# Patient Record
Sex: Male | Born: 1950 | Race: White | Hispanic: No | Smoking: Current some day smoker
Health system: Southern US, Community
[De-identification: ages and names within clinical notes are randomized; demographics above are authoritative.]

## PROBLEM LIST (undated history)

## (undated) DIAGNOSIS — N400 Enlarged prostate without lower urinary tract symptoms: Secondary | ICD-10-CM

## (undated) DIAGNOSIS — E871 Hypo-osmolality and hyponatremia: Secondary | ICD-10-CM

## (undated) DIAGNOSIS — G4700 Insomnia, unspecified: Secondary | ICD-10-CM

## (undated) DIAGNOSIS — C801 Malignant (primary) neoplasm, unspecified: Secondary | ICD-10-CM

## (undated) DIAGNOSIS — F32A Depression, unspecified: Secondary | ICD-10-CM

## (undated) DIAGNOSIS — I10 Essential (primary) hypertension: Secondary | ICD-10-CM

## (undated) DIAGNOSIS — G8929 Other chronic pain: Secondary | ICD-10-CM

## (undated) DIAGNOSIS — E785 Hyperlipidemia, unspecified: Secondary | ICD-10-CM

## (undated) DIAGNOSIS — K5909 Other constipation: Secondary | ICD-10-CM

## (undated) DIAGNOSIS — B1920 Unspecified viral hepatitis C without hepatic coma: Secondary | ICD-10-CM

## (undated) DIAGNOSIS — J449 Chronic obstructive pulmonary disease, unspecified: Secondary | ICD-10-CM

## (undated) DIAGNOSIS — F329 Major depressive disorder, single episode, unspecified: Secondary | ICD-10-CM

## (undated) DIAGNOSIS — E039 Hypothyroidism, unspecified: Secondary | ICD-10-CM

## (undated) HISTORY — PX: ADRENALECTOMY: SHX876

## (undated) HISTORY — PX: NEPHRECTOMY: SHX65

---

## 2015-08-25 ENCOUNTER — Emergency Department (HOSPITAL_COMMUNITY)
Admission: EM | Admit: 2015-08-25 | Discharge: 2015-08-25 | Disposition: A | Discharge status: Home / Self Care | Payer: Medicare Other | Attending: Emergency Medicine | Admitting: Emergency Medicine

## 2015-08-25 ENCOUNTER — Encounter (HOSPITAL_COMMUNITY): Payer: Self-pay | Admitting: Emergency Medicine

## 2015-08-25 ENCOUNTER — Emergency Department (HOSPITAL_COMMUNITY): Payer: Medicare Other

## 2015-08-25 DIAGNOSIS — Z87438 Personal history of other diseases of male genital organs: Secondary | ICD-10-CM | POA: Diagnosis not present

## 2015-08-25 DIAGNOSIS — R3919 Other difficulties with micturition: Secondary | ICD-10-CM | POA: Diagnosis not present

## 2015-08-25 DIAGNOSIS — Z8669 Personal history of other diseases of the nervous system and sense organs: Secondary | ICD-10-CM | POA: Insufficient documentation

## 2015-08-25 DIAGNOSIS — J449 Chronic obstructive pulmonary disease, unspecified: Secondary | ICD-10-CM | POA: Insufficient documentation

## 2015-08-25 DIAGNOSIS — I10 Essential (primary) hypertension: Secondary | ICD-10-CM | POA: Insufficient documentation

## 2015-08-25 DIAGNOSIS — Z8619 Personal history of other infectious and parasitic diseases: Secondary | ICD-10-CM | POA: Insufficient documentation

## 2015-08-25 DIAGNOSIS — Z8639 Personal history of other endocrine, nutritional and metabolic disease: Secondary | ICD-10-CM | POA: Insufficient documentation

## 2015-08-25 DIAGNOSIS — Z8659 Personal history of other mental and behavioral disorders: Secondary | ICD-10-CM | POA: Insufficient documentation

## 2015-08-25 DIAGNOSIS — Z8719 Personal history of other diseases of the digestive system: Secondary | ICD-10-CM | POA: Diagnosis not present

## 2015-08-25 DIAGNOSIS — R339 Retention of urine, unspecified: Secondary | ICD-10-CM | POA: Diagnosis present

## 2015-08-25 DIAGNOSIS — Z87891 Personal history of nicotine dependence: Secondary | ICD-10-CM | POA: Insufficient documentation

## 2015-08-25 DIAGNOSIS — R39198 Other difficulties with micturition: Secondary | ICD-10-CM

## 2015-08-25 HISTORY — DX: Malignant (primary) neoplasm, unspecified: C80.1

## 2015-08-25 HISTORY — DX: Benign prostatic hyperplasia without lower urinary tract symptoms: N40.0

## 2015-08-25 HISTORY — DX: Other constipation: K59.09

## 2015-08-25 HISTORY — DX: Unspecified viral hepatitis C without hepatic coma: B19.20

## 2015-08-25 HISTORY — DX: Insomnia, unspecified: G47.00

## 2015-08-25 HISTORY — DX: Chronic obstructive pulmonary disease, unspecified: J44.9

## 2015-08-25 HISTORY — DX: Major depressive disorder, single episode, unspecified: F32.9

## 2015-08-25 HISTORY — DX: Depression, unspecified: F32.A

## 2015-08-25 HISTORY — DX: Hyperlipidemia, unspecified: E78.5

## 2015-08-25 HISTORY — DX: Hypo-osmolality and hyponatremia: E87.1

## 2015-08-25 HISTORY — DX: Essential (primary) hypertension: I10

## 2015-08-25 LAB — URINALYSIS, ROUTINE W REFLEX MICROSCOPIC
BILIRUBIN URINE: NEGATIVE
Glucose, UA: NEGATIVE mg/dL
KETONES UR: NEGATIVE mg/dL
NITRITE: NEGATIVE
Protein, ur: NEGATIVE mg/dL
SPECIFIC GRAVITY, URINE: 1.008 (ref 1.005–1.030)
UROBILINOGEN UA: 0.2 mg/dL (ref 0.0–1.0)
pH: 7 (ref 5.0–8.0)

## 2015-08-25 LAB — CBC WITH DIFFERENTIAL/PLATELET
BASOS PCT: 0 % (ref 0–1)
Basophils Absolute: 0 10*3/uL (ref 0.0–0.1)
EOS ABS: 0.4 10*3/uL (ref 0.0–0.7)
EOS PCT: 4 % (ref 0–5)
HCT: 39.5 % (ref 39.0–52.0)
Hemoglobin: 13.2 g/dL (ref 13.0–17.0)
LYMPHS ABS: 2.5 10*3/uL (ref 0.7–4.0)
Lymphocytes Relative: 27 % (ref 12–46)
MCH: 29.1 pg (ref 26.0–34.0)
MCHC: 33.4 g/dL (ref 30.0–36.0)
MCV: 87.2 fL (ref 78.0–100.0)
Monocytes Absolute: 0.7 10*3/uL (ref 0.1–1.0)
Monocytes Relative: 7 % (ref 3–12)
NEUTROS PCT: 62 % (ref 43–77)
Neutro Abs: 5.7 10*3/uL (ref 1.7–7.7)
PLATELETS: 239 10*3/uL (ref 150–400)
RBC: 4.53 MIL/uL (ref 4.22–5.81)
RDW: 12.9 % (ref 11.5–15.5)
WBC: 9.2 10*3/uL (ref 4.0–10.5)

## 2015-08-25 LAB — URINE MICROSCOPIC-ADD ON

## 2015-08-25 LAB — BASIC METABOLIC PANEL
Anion gap: 6 (ref 5–15)
BUN: 25 mg/dL — AB (ref 6–20)
CO2: 23 mmol/L (ref 22–32)
CREATININE: 2.02 mg/dL — AB (ref 0.61–1.24)
Calcium: 9.2 mg/dL (ref 8.9–10.3)
Chloride: 99 mmol/L — ABNORMAL LOW (ref 101–111)
GFR, EST AFRICAN AMERICAN: 38 mL/min — AB (ref >60)
GFR, EST NON AFRICAN AMERICAN: 33 mL/min — AB (ref >60)
Glucose, Bld: 90 mg/dL (ref 65–99)
Potassium: 4.3 mmol/L (ref 3.5–5.1)
SODIUM: 128 mmol/L — AB (ref 135–145)

## 2015-08-25 MED ORDER — CEPHALEXIN 500 MG PO CAPS: 500.0000 mg | ORAL_CAPSULE | Freq: Three times a day (TID) | ORAL | Status: DC

## 2015-08-25 NOTE — Discharge Instructions (Signed)
Take the prescribed medication as directed. Follow-up with your urologist as soon as possible. Return to the ED for new or worsening symptoms.

## 2015-08-25 NOTE — ED Notes (Signed)
Patient presents to ED via [facility].  Patient states he urinated this morning, but now it's hard to pee.

## 2015-08-25 NOTE — Progress Notes (Signed)
Patient listed as having Medicare insurance without a pcp.  Patient reports he is from [place].  Patient reports he has a doctor at the [facility] in [place].  Per ALF paper work, patient's pcp at nursing facility is Dr Cipriano Bunker.  System updated.

## 2015-08-25 NOTE — ED Notes (Signed)
Patient had 250cc of urine out with zero on the bladder scan after void.

## 2015-08-25 NOTE — ED Notes (Signed)
Patient finished dinner tray and is putting his clothes on at this time.  Awaiting transport back to [facility].

## 2015-08-25 NOTE — ED Provider Notes (Signed)
CSN: [id number]     Arrival date & time 08/25/15  1609 History   First MD Initiated Contact with Patient 08/25/15 1620     Chief Complaint  Patient presents with  . Urinary Retention     (Consider location/radiation/quality/duration/timing/severity/associated sxs/prior Treatment) The history is provided by the patient and medical records.    This is a 64 y.o. M with hx of HTN, hyponatremia, HLP, BPH, hepatitis, COPD, depression, insomnia, presenting to the ED for difficulty urinating.  Patient states he was able to urinate normally this morning, however has since been having some difficulty urinating.  He states he feels the need to urinate, however has some difficulty actually passing the urine.  He denies dysuria or hematuria.  He does take flomax for BPH.  He is followed by urologist in [facility], does not remember his name.  He has never required foley catheter placement.  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  He states he has chronic constipation, last BM was yesterday.  He is freely passing flatus.  No fever, chills, sweats, or weakness noted.  VSS.  Past Medical History  Diagnosis Date  . Hyponatremia   . Hypertension   . Hyperlipidemia   . BPH (benign prostatic hypertrophy)   . Hepatitis C   . COPD (chronic obstructive pulmonary disease)   . Chronic constipation   . Cancer   . Depression   . Insomnia    History reviewed. No pertinent past surgical history. No family history on file. Social History  Substance Use Topics  . Smoking status: Former Research scientist (life sciences)  . Smokeless tobacco: None  . Alcohol Use: None    Review of Systems  Genitourinary: Positive for difficulty urinating.  All other systems reviewed and are negative.     Allergies  Review of patient's allergies indicates no known allergies.  Home Medications   Prior to Admission medications   Not on File   BP 149/89 mmHg  Pulse 65  Temp(Src) 98.9 F (37.2 C) (Oral)  Resp 20   Ht 5\' 7"  (1.702 m)  Wt 140 lb (63.504 kg)  BMI 21.92 kg/m2  SpO2 98%   Physical Exam  Constitutional: He is oriented to person, place, and time. He appears well-developed and well-nourished.  HENT:  Head: Normocephalic and atraumatic.  Mouth/Throat: Oropharynx is clear and moist.  Eyes: Conjunctivae and EOM are normal. Pupils are equal, round, and reactive to light.  Neck: Normal range of motion.  Cardiovascular: Normal rate, regular rhythm and normal heart sounds.   Pulmonary/Chest: Effort normal and breath sounds normal. No respiratory distress. He has no wheezes.  Abdominal: Soft. Bowel sounds are normal. He exhibits no distension. There is no tenderness. There is no guarding.  Abdomen soft, nondistended, normal bowel sounds, no tenderness or peritoneal signs  Musculoskeletal: Normal range of motion. He exhibits no edema.  Neurological: He is alert and oriented to person, place, and time.  Skin: Skin is warm and dry.  Psychiatric: He has a normal mood and affect.  Nursing note and vitals reviewed.   ED Course  Procedures (including critical care time) Labs Review Labs Reviewed  URINALYSIS, ROUTINE W REFLEX MICROSCOPIC (NOT AT [facility]) - Abnormal; Notable for the following:    APPearance CLOUDY (*)    Hgb urine dipstick TRACE (*)    Leukocytes, UA SMALL (*)    All other components within normal limits  BASIC METABOLIC PANEL - Abnormal; Notable for the following:  Sodium 128 (*)    Chloride 99 (*)    BUN 25 (*)    Creatinine, Ser 2.02 (*)    GFR calc non Af Amer 33 (*)    GFR calc Af Amer 38 (*)    All other components within normal limits  URINE MICROSCOPIC-ADD ON - Abnormal; Notable for the following:    Bacteria, UA FEW (*)    All other components within normal limits  URINE CULTURE  CBC WITH DIFFERENTIAL/PLATELET    Imaging Review Dg Abd 1 View  08/25/2015   CLINICAL DATA:  Difficulty urinating and constipation.  EXAM: ABDOMEN - 1 VIEW  COMPARISON:  None.   FINDINGS: Bowel gas pattern appears nonobstructive. No evidence of soft tissue mass or abnormal fluid collection seen. Bladder does, however, appear moderately distended within the pelvis. No evidence of free intraperitoneal air.  Surgical sutures are seen along the midline abdomen and pelvis. Mild degenerative change noted within the lower lumbar spine. No acute osseous abnormality seen.  IMPRESSION: Nonobstructive bowel gas pattern and no evidence of acute intra-abdominal abnormality.  Bladder appears to be at least moderately distended within the pelvis, compatible with given clinical data of difficulty urinating.   Electronically Signed   By: Franki Cabot M.D.   On: 08/25/2015 16:43   I have personally reviewed and evaluated these lab results as part of my medical decision-making.   EKG Interpretation None      MDM   Final diagnoses:  Difficulty urinating   64 year old male here with difficulty urinating. He reports straining with urination. He has a history of BPH, currently on Flomax. Followed by neurology in [facility]. Patient is afebrile, nontoxic. Abdominal exam is benign.  SrCr 2.02-- patient has known CKD and solitary kidney.  This is likely his baseline, however no prior values available for comparison.  abd films taken prior to urination, some bladder distention noted without obstructive symptoms.  Patient was able to fully urinate 250 mL here in ED, pos tvoid residual of 0.  U/a with few bacteria, 7-10.  Will send for culture.  His change in urinary symptoms may be due to his BPH, however u/a with questionable infection.  Will start on keflex pending urine culture.  Patient to follow-up with his urologist.  Discussed plan with patient, he/she acknowledged understanding and agreed with plan of care.  Return precautions given for new or worsening symptoms.  Case discussed with attending physician, Dr. Johnney Killian, who evaluated patient and agrees with assessment and plan of  care.  Larene Pickett, PA-C 08/25/15 [place], MD 09/07/15 [phone]

## 2015-08-29 LAB — URINE CULTURE: Culture: >=100000

## 2015-08-30 NOTE — Progress Notes (Signed)
ED Antimicrobial Stewardship Positive Culture Follow Up   Luke Wiggins is an 64 y.o. male who presented to [facility] on 08/25/2015 with a chief complaint of  Chief Complaint  Patient presents with  . Urinary Retention    Recent Results (from the past 720 hour(s))  Urine culture     Status: None   Collection Time: 08/25/15  5:57 PM  Result Value Ref Range Status   Specimen Description URINE, CLEAN CATCH  Final   Special Requests NONE  Final   Culture   Final    >=100,000 COLONIES/mL STAPHYLOCOCCUS SPECIES (COAGULASE NEGATIVE) Performed at [facility]    Report Status 08/29/2015 FINAL  Final   Organism ID, Bacteria STAPHYLOCOCCUS SPECIES (COAGULASE NEGATIVE)  Final      Susceptibility   Staphylococcus species (coagulase negative) - MIC*    CIPROFLOXACIN >=8 RESISTANT Resistant     GENTAMICIN <=0.5 SENSITIVE Sensitive     NITROFURANTOIN <=16 SENSITIVE Sensitive     OXACILLIN <=0.25 RESISTANT Resistant     TETRACYCLINE <=1 SENSITIVE Sensitive     VANCOMYCIN <=0.5 SENSITIVE Sensitive     TRIMETH/SULFA <=10 SENSITIVE Sensitive     CLINDAMYCIN <=0.25 RESISTANT Resistant     RIFAMPIN <=0.5 SENSITIVE Sensitive     Inducible Clindamycin POSITIVE Resistant     * >=100,000 COLONIES/mL STAPHYLOCOCCUS SPECIES (COAGULASE NEGATIVE)    [x]  Treated with cephalexin, organism resistant to prescribed antimicrobial  New antibiotic prescription: Stop Keflex. Start Doxycycline 100 mg BID x 7 days.   ED Provider: Glendell Docker, PA-C   Ricka Burdock 08/30/2015, 8:14 AM Infectious Diseases Pharmacist Phone# [phone]

## 2015-08-31 ENCOUNTER — Telehealth (HOSPITAL_COMMUNITY): Payer: Self-pay | Admitting: Emergency Medicine

## 2015-08-31 NOTE — Telephone Encounter (Signed)
pt resident @ [facility]. Notified Keisha @ [facility] of positive urine culture and need for change in antibiotic. Result with new med order faxed to [facility] [phone].

## 2015-09-07 ENCOUNTER — Inpatient Hospital Stay (HOSPITAL_COMMUNITY)
Admission: EM | Admit: 2015-09-07 | Discharge: 2015-09-08 | DRG: 392 | Disposition: A | Discharge status: Home / Self Care | Payer: Medicare Other | Attending: Internal Medicine | Admitting: Internal Medicine

## 2015-09-07 ENCOUNTER — Emergency Department (HOSPITAL_COMMUNITY): Payer: Medicare Other

## 2015-09-07 ENCOUNTER — Encounter (HOSPITAL_COMMUNITY): Payer: Self-pay | Admitting: *Deleted

## 2015-09-07 DIAGNOSIS — I700 Atherosclerosis of aorta: Secondary | ICD-10-CM | POA: Diagnosis present

## 2015-09-07 DIAGNOSIS — Z8619 Personal history of other infectious and parasitic diseases: Secondary | ICD-10-CM

## 2015-09-07 DIAGNOSIS — R109 Unspecified abdominal pain: Secondary | ICD-10-CM | POA: Diagnosis present

## 2015-09-07 DIAGNOSIS — B1920 Unspecified viral hepatitis C without hepatic coma: Secondary | ICD-10-CM | POA: Diagnosis not present

## 2015-09-07 DIAGNOSIS — F329 Major depressive disorder, single episode, unspecified: Secondary | ICD-10-CM | POA: Diagnosis present

## 2015-09-07 DIAGNOSIS — R1084 Generalized abdominal pain: Secondary | ICD-10-CM | POA: Diagnosis present

## 2015-09-07 DIAGNOSIS — I129 Hypertensive chronic kidney disease with stage 1 through stage 4 chronic kidney disease, or unspecified chronic kidney disease: Secondary | ICD-10-CM | POA: Diagnosis present

## 2015-09-07 DIAGNOSIS — G4700 Insomnia, unspecified: Secondary | ICD-10-CM | POA: Diagnosis present

## 2015-09-07 DIAGNOSIS — N183 Chronic kidney disease, stage 3 unspecified: Secondary | ICD-10-CM | POA: Diagnosis present

## 2015-09-07 DIAGNOSIS — J449 Chronic obstructive pulmonary disease, unspecified: Secondary | ICD-10-CM | POA: Diagnosis present

## 2015-09-07 DIAGNOSIS — E039 Hypothyroidism, unspecified: Secondary | ICD-10-CM | POA: Diagnosis present

## 2015-09-07 DIAGNOSIS — R338 Other retention of urine: Secondary | ICD-10-CM | POA: Diagnosis present

## 2015-09-07 DIAGNOSIS — K5900 Constipation, unspecified: Principal | ICD-10-CM | POA: Diagnosis present

## 2015-09-07 DIAGNOSIS — N400 Enlarged prostate without lower urinary tract symptoms: Secondary | ICD-10-CM | POA: Diagnosis not present

## 2015-09-07 DIAGNOSIS — I10 Essential (primary) hypertension: Secondary | ICD-10-CM | POA: Diagnosis not present

## 2015-09-07 DIAGNOSIS — Z7982 Long term (current) use of aspirin: Secondary | ICD-10-CM | POA: Diagnosis not present

## 2015-09-07 DIAGNOSIS — R339 Retention of urine, unspecified: Secondary | ICD-10-CM | POA: Diagnosis not present

## 2015-09-07 DIAGNOSIS — E785 Hyperlipidemia, unspecified: Secondary | ICD-10-CM | POA: Diagnosis present

## 2015-09-07 DIAGNOSIS — N401 Enlarged prostate with lower urinary tract symptoms: Secondary | ICD-10-CM | POA: Diagnosis present

## 2015-09-07 DIAGNOSIS — Z79899 Other long term (current) drug therapy: Secondary | ICD-10-CM

## 2015-09-07 DIAGNOSIS — K219 Gastro-esophageal reflux disease without esophagitis: Secondary | ICD-10-CM | POA: Diagnosis present

## 2015-09-07 DIAGNOSIS — N131 Hydronephrosis with ureteral stricture, not elsewhere classified: Secondary | ICD-10-CM | POA: Diagnosis not present

## 2015-09-07 DIAGNOSIS — Z87891 Personal history of nicotine dependence: Secondary | ICD-10-CM | POA: Diagnosis not present

## 2015-09-07 DIAGNOSIS — F32A Depression, unspecified: Secondary | ICD-10-CM | POA: Diagnosis present

## 2015-09-07 DIAGNOSIS — N1330 Unspecified hydronephrosis: Secondary | ICD-10-CM | POA: Diagnosis present

## 2015-09-07 HISTORY — DX: Hypothyroidism, unspecified: E03.9

## 2015-09-07 LAB — COMPREHENSIVE METABOLIC PANEL
ALBUMIN: 4 g/dL (ref 3.5–5.0)
ALT: 14 U/L — ABNORMAL LOW (ref 17–63)
ANION GAP: 10 (ref 5–15)
AST: 17 U/L (ref 15–41)
Alkaline Phosphatase: 69 U/L (ref 38–126)
BUN: 26 mg/dL — AB (ref 6–20)
CHLORIDE: 103 mmol/L (ref 101–111)
CO2: 17 mmol/L — ABNORMAL LOW (ref 22–32)
Calcium: 9.4 mg/dL (ref 8.9–10.3)
Creatinine, Ser: 1.88 mg/dL — ABNORMAL HIGH (ref 0.61–1.24)
GFR calc Af Amer: 42 mL/min — ABNORMAL LOW (ref >60)
GFR, EST NON AFRICAN AMERICAN: 36 mL/min — AB (ref >60)
Glucose, Bld: 88 mg/dL (ref 65–99)
POTASSIUM: 4.4 mmol/L (ref 3.5–5.1)
Sodium: 130 mmol/L — ABNORMAL LOW (ref 135–145)
Total Bilirubin: 0.9 mg/dL (ref 0.3–1.2)
Total Protein: 8.3 g/dL — ABNORMAL HIGH (ref 6.5–8.1)

## 2015-09-07 LAB — CBC WITH DIFFERENTIAL/PLATELET
BASOS ABS: 0 10*3/uL (ref 0.0–0.1)
BASOS PCT: 0 % (ref 0–1)
EOS PCT: 3 % (ref 0–5)
Eosinophils Absolute: 0.3 10*3/uL (ref 0.0–0.7)
HCT: 40.7 % (ref 39.0–52.0)
Hemoglobin: 13.4 g/dL (ref 13.0–17.0)
Lymphocytes Relative: 24 % (ref 12–46)
Lymphs Abs: 2.3 10*3/uL (ref 0.7–4.0)
MCH: 28.8 pg (ref 26.0–34.0)
MCHC: 32.9 g/dL (ref 30.0–36.0)
MCV: 87.5 fL (ref 78.0–100.0)
MONO ABS: 0.8 10*3/uL (ref 0.1–1.0)
Monocytes Relative: 8 % (ref 3–12)
Neutro Abs: 6.1 10*3/uL (ref 1.7–7.7)
Neutrophils Relative %: 65 % (ref 43–77)
PLATELETS: 245 10*3/uL (ref 150–400)
RBC: 4.65 MIL/uL (ref 4.22–5.81)
RDW: 13.3 % (ref 11.5–15.5)
WBC: 9.4 10*3/uL (ref 4.0–10.5)

## 2015-09-07 LAB — URINALYSIS, ROUTINE W REFLEX MICROSCOPIC
BILIRUBIN URINE: NEGATIVE
GLUCOSE, UA: NEGATIVE mg/dL
KETONES UR: NEGATIVE mg/dL
Leukocytes, UA: NEGATIVE
NITRITE: NEGATIVE
PH: 6.5 (ref 5.0–8.0)
Protein, ur: 30 mg/dL — AB
Specific Gravity, Urine: 1.01 (ref 1.005–1.030)
Urobilinogen, UA: 0.2 mg/dL (ref 0.0–1.0)

## 2015-09-07 LAB — URINE MICROSCOPIC-ADD ON

## 2015-09-07 LAB — LIPASE, BLOOD: LIPASE: 32 U/L (ref 22–51)

## 2015-09-07 MED ORDER — ONDANSETRON HCL 4 MG/2ML IJ SOLN
4.0000 mg | Freq: Once | INTRAMUSCULAR | Status: AC
  Administered 2015-09-07: 4 mg via INTRAVENOUS
  Filled 2015-09-07: qty 2

## 2015-09-07 MED ORDER — DEXTROSE 5 % IV SOLN
1.0000 g | Freq: Once | INTRAVENOUS | Status: AC
  Administered 2015-09-07: 1 g via INTRAVENOUS
  Filled 2015-09-07: qty 10

## 2015-09-07 MED ORDER — IOHEXOL 300 MG/ML  SOLN
25.0000 mL | Freq: Once | INTRAMUSCULAR | Status: AC | PRN
  Administered 2015-09-07: 25 mL via ORAL

## 2015-09-07 MED ORDER — SODIUM CHLORIDE 0.9 % IV BOLUS (SEPSIS)
1000.0000 mL | Freq: Once | INTRAVENOUS | Status: AC
  Administered 2015-09-07: 1000 mL via INTRAVENOUS

## 2015-09-07 MED ORDER — IOHEXOL 300 MG/ML  SOLN: 100.0000 mL | Freq: Once | INTRAMUSCULAR | Status: DC | PRN

## 2015-09-07 MED ORDER — RANITIDINE HCL 150 MG/10ML PO SYRP
150.0000 mg | ORAL_SOLUTION | Freq: Once | ORAL | Status: AC
  Administered 2015-09-07: 150 mg via ORAL
  Filled 2015-09-07: qty 10

## 2015-09-07 MED ORDER — HYDROCODONE-ACETAMINOPHEN 5-325 MG PO TABS
1.0000 | ORAL_TABLET | Freq: Once | ORAL | Status: AC
  Administered 2015-09-07: 1 via ORAL
  Filled 2015-09-07: qty 1

## 2015-09-07 MED ORDER — ZOLPIDEM TARTRATE 5 MG PO TABS
5.0000 mg | ORAL_TABLET | Freq: Every evening | ORAL | Status: DC | PRN
  Administered 2015-09-07: 5 mg via ORAL
  Filled 2015-09-07: qty 1

## 2015-09-07 NOTE — ED Notes (Signed)
Patient from [facility]. Patient has hx of chronic constipation. Last BM 09/05/15 States abdominal pain started this am 5/10.

## 2015-09-07 NOTE — ED Notes (Signed)
Pt was complaining of experienicing "heart burn" which he reports is a chronic condition. Notified provider. Provider requested an EKG. While obtaining an EKG, patient started vomiting. Provider came to bedside to assess patient.

## 2015-09-07 NOTE — ED Provider Notes (Signed)
CSN: [id number]     Arrival date & time 09/07/15  1641 History   First MD Initiated Contact with Patient 09/07/15 1701     Chief Complaint  Patient presents with  . Abdominal Pain    constipation last BM on 07/05/15     (Consider location/radiation/quality/duration/timing/severity/associated sxs/prior Treatment) Patient is a 64 y.o. male presenting with abdominal pain.  Abdominal Pain Pain location:  Generalized Pain quality: cramping   Pain radiates to:  Does not radiate Pain severity:  Severe Onset quality:  Gradual Duration:  1 day Timing:  Constant Progression:  Worsening Chronicity:  New Ineffective treatments: miralax, lactulose. Associated symptoms: constipation (chronic, last BM was 2 days ago, however on arrival to ED had 4 loose stool, reports continuing pain following this) and diarrhea (on arrival to ED)   Associated symptoms: no chest pain, no cough, no dysuria, no fatigue, no fever, no nausea, no shortness of breath, no sore throat and no vomiting   Risk factors: has not had multiple surgeries and no recent hospitalization     Past Medical History  Diagnosis Date  . Hyponatremia   . Hypertension   . Hyperlipidemia   . BPH (benign prostatic hypertrophy)   . Hepatitis C   . COPD (chronic obstructive pulmonary disease)   . Chronic constipation   . Cancer   . Depression   . Insomnia   . Hypothyroidism    Past Surgical History  Procedure Laterality Date  . Adrenalectomy Left   . Nephrectomy Left    Family History  Problem Relation Age of Onset  . Family history unknown: Yes   Social History  Substance Use Topics  . Smoking status: Former Research scientist (life sciences)  . Smokeless tobacco: None  . Alcohol Use: No    Review of Systems  Constitutional: Negative for fever and fatigue.  HENT: Negative for sore throat.   Eyes: Negative for visual disturbance.  Respiratory: Negative for cough and shortness of breath.   Cardiovascular: Negative for chest pain.   Gastrointestinal: Positive for abdominal pain, diarrhea (on arrival to ED) and constipation (chronic, last BM was 2 days ago, however on arrival to ED had 4 loose stool, reports continuing pain following this). Negative for nausea and vomiting.  Genitourinary: Negative for dysuria and difficulty urinating (had this 8/25, diagnosed with UTI, on doxy).  Musculoskeletal: Negative for back pain and neck stiffness.  Skin: Negative for rash.  Neurological: Negative for syncope and headaches.      Allergies  Review of patient's allergies indicates no known allergies.  Home Medications   Prior to Admission medications   Medication Sig Start Date End Date Taking? Authorizing Provider  amLODipine (NORVASC) 5 MG tablet Take 5 mg by mouth daily.   Yes Historical Provider, MD  aspirin EC 81 MG tablet Take 81 mg by mouth daily.   Yes Historical Provider, MD  calcium carbonate (TUMS - DOSED IN MG ELEMENTAL CALCIUM) 500 MG chewable tablet Chew 2 tablets by mouth every 6 (six) hours.    Yes Historical Provider, MD  docusate sodium (COLACE) 100 MG capsule Take 200 mg by mouth daily.   Yes Historical Provider, MD  finasteride (PROSCAR) 5 MG tablet Take 5 mg by mouth daily.   Yes Historical Provider, MD  folic acid (FOLVITE) 1 MG tablet Take 1 mg by mouth daily.   Yes Historical Provider, MD  lactulose (CHRONULAC) 10 GM/15ML solution Take 30 g by mouth 2 (two) times daily.   Yes Historical Provider, MD  levothyroxine (SYNTHROID,  LEVOTHROID) 25 MCG tablet Take 25 mcg by mouth daily before breakfast.   Yes Historical Provider, MD  Menthol-Methyl Salicylate (BENGAY GREASELESS EX) Apply 1 application topically 3 (three) times daily.   Yes Historical Provider, MD  mirtazapine (REMERON) 30 MG tablet Take 30 mg by mouth at bedtime.   Yes Historical Provider, MD  nepafenac (ILEVRO) 0.3 % ophthalmic suspension Place 1 drop into the left eye daily.   Yes Historical Provider, MD  polyethylene glycol (MIRALAX /  GLYCOLAX) packet Take 17 g by mouth daily.   Yes Historical Provider, MD  sodium chloride 1 G tablet Take 1 g by mouth 2 (two) times daily. 8am and 2pm   Yes Historical Provider, MD  tamsulosin (FLOMAX) 0.4 MG CAPS capsule Take 0.4 mg by mouth daily.   Yes Historical Provider, MD  temazepam (RESTORIL) 30 MG capsule Take 30 mg by mouth at bedtime.    Yes Historical Provider, MD  thiamine (VITAMIN B-1) 100 MG tablet Take 100 mg by mouth daily.   Yes Historical Provider, MD  tiotropium (SPIRIVA) 18 MCG inhalation capsule Place 18 mcg into inhaler and inhale daily.   Yes Historical Provider, MD  albuterol (PROVENTIL HFA;VENTOLIN HFA) 108 (90 BASE) MCG/ACT inhaler Inhale 2 puffs into the lungs every 6 (six) hours as needed for wheezing or shortness of breath.    Historical Provider, MD  cephALEXin (KEFLEX) 500 MG capsule Take 1 capsule (500 mg total) by mouth 3 (three) times daily. Patient not taking: Reported on 09/07/2015 08/25/15   Larene Pickett, PA-C   BP 133/86 mmHg  Pulse 77  Temp(Src) 98.4 F (36.9 C) (Oral)  Resp 18  Ht 5\' 7"  (1.702 m)  SpO2 98% Physical Exam  Constitutional: He is oriented to person, place, and time. He appears well-developed and well-nourished. No distress.  HENT:  Head: Normocephalic and atraumatic.  Eyes: Conjunctivae and EOM are normal.  Neck: Normal range of motion.  Cardiovascular: Normal rate, regular rhythm, normal heart sounds and intact distal pulses.  Exam reveals no gallop and no friction rub.   No murmur heard. Pulmonary/Chest: Effort normal and breath sounds normal. No respiratory distress. He has no wheezes. He has no rales.  Abdominal: Soft. He exhibits no distension. There is tenderness (LLQ). There is no guarding.  Musculoskeletal: He exhibits no edema.  Neurological: He is alert and oriented to person, place, and time.  Skin: Skin is warm and dry. He is not diaphoretic.  Nursing note and vitals reviewed.   ED Course  Procedures (including  critical care time) Labs Review Labs Reviewed  COMPREHENSIVE METABOLIC PANEL - Abnormal; Notable for the following:    Sodium 130 (*)    CO2 17 (*)    BUN 26 (*)    Creatinine, Ser 1.88 (*)    Total Protein 8.3 (*)    ALT 14 (*)    GFR calc non Af Amer 36 (*)    GFR calc Af Amer 42 (*)    All other components within normal limits  URINALYSIS, ROUTINE W REFLEX MICROSCOPIC (NOT AT [facility]) - Abnormal; Notable for the following:    Hgb urine dipstick TRACE (*)    Protein, ur 30 (*)    All other components within normal limits  LIPID PANEL - Abnormal; Notable for the following:    Cholesterol 206 (*)    LDL Cholesterol 137 (*)    All other components within normal limits  BASIC METABOLIC PANEL - Abnormal; Notable for the following:  Sodium 132 (*)    CO2 21 (*)    Glucose, Bld 126 (*)    BUN 22 (*)    Creatinine, Ser 1.78 (*)    Calcium 8.6 (*)    GFR calc non Af Amer 38 (*)    GFR calc Af Amer 44 (*)    All other components within normal limits  CBC - Abnormal; Notable for the following:    WBC 12.7 (*)    All other components within normal limits  MRSA PCR SCREENING  URINE CULTURE  CBC WITH DIFFERENTIAL/PLATELET  LIPASE, BLOOD  URINE MICROSCOPIC-ADD ON  PROTIME-INR  APTT  TSH  GLUCOSE, CAPILLARY  TYPE AND SCREEN  ABO/RH    Imaging Review Ct Abdomen Pelvis Wo Contrast  09/07/2015   CLINICAL DATA:  Chronic constipation. Abdomen pain starting this morning evaluate for diverticulitis.  EXAM: CT ABDOMEN AND PELVIS WITHOUT CONTRAST  TECHNIQUE: Multidetector CT imaging of the abdomen and pelvis was performed following the standard protocol without IV contrast.  COMPARISON:  None.  FINDINGS: The liver, spleen, pancreas, gallbladder are normal. Patient is status post left adrenalectomy and nephrectomy. For there is atherosclerosis of the abdominal aorta without aneurysmal dilatation. There is no abdominal lymphadenopathy.  There is right perinephric stranding with mild right  hydronephrosis and dilatation of the proximal right ureter. The caliber of the mid to distal right ureter is normal. There is a 1 mm stone in a right renal calyx extending towards the pelvis.  For for there is no small bowel obstruction or diverticulitis. Minimal hiatal hernia is identified.  Fluid-filled bladder is normal. There is mild scar of the right lung base. Degenerative joint changes of the spine are noted.  IMPRESSION: Right perinephric stranding with mild right hydronephrosis and dilatation of the proximal right ureter. The caliber of the mid to distal right ureter is normal. There is a 1 mm stone in a right renal calyx extending towards the pelvis. No other renal or ureteral stones are noted. The findings could be due to pyelonephritis or recently passed stone. Clinical correlation is recommended.   Electronically Signed   By: Abelardo Diesel M.D.   On: 09/07/2015 20:06   I have personally reviewed and evaluated these images and lab results as part of my medical decision-making.   EKG Interpretation   Date/Time:  Wednesday September 07 2015 22:42:07 EDT Ventricular Rate:  92 PR Interval:  136 QRS Duration: 85 QT Interval:  346 QTC Calculation: 428 R Axis:   66 Text Interpretation:  Sinus rhythm Borderline repolarization abnormality  ED PHYSICIAN INTERPRETATION AVAILABLE IN [facility] Confirmed by  TEST, Record ([id number]) on 09/08/2015 7:04:04 AM      MDM   Final diagnoses:  Abdominal pain, generalized  Essential hypertension  Hyperlipidemia  BPH (benign prostatic hypertrophy)  Depression  Insomnia  Hypothyroidism, unspecified hypothyroidism type   64yo male with history of CVA, chronic constipation, left nephrectomy and adrenalectomy, recent visit to ED 8/25 with concerns of urinary retention, presents with concern of abdominal pain.  Pt initially reports constipation for last 2 days, however had several over 4 episodes of diarrhea while in ED and reported continuing pain  despite BM.  Pt had some LLQ tenderness on exam and given continuing pain a CT was ordered to evaluate for diverticulitis. CT did not show any colonic abnormalities, however did show mild hydronephrosis, dilated right ureter, and right renal stranding.  Cr 1.8 (2 on 8/25) with unknown baseline.  Pt with recent urine culture from  8/25 showing coag neg staph and has been on doxycycline.  Urinalysis at this time does not suggest UTI, however given hx and stranding gave rocephin.  Prior to receiving any abx pt developed emesis, which was not relieved by 4mg  of Zofran and had continued episodes in the ED. Pt reported chronic heart burn consistent with prior and ekg done showing no acute abnormalities. Called Urology and discussed concerns of hydronephrosis in pt with solitary kidney, and they recommended placement of foley for relief of possible bladder outlet obstruction and nonemergent consult in the AM if indicated.  Pt to be admitted to hospitalist for concern of intractable emesis, possible right sided ?pyelonephritis vs other obstruction in pt with solitary kidney.     Gareth Morgan, MD 09/08/15 1217

## 2015-09-07 NOTE — ED Notes (Signed)
Bed: [facility] Expected date:  Expected time:  Means of arrival:  Comments: EMS- 64yo M, abdominal pain

## 2015-09-07 NOTE — ED Notes (Signed)
Bed: [facility]23 Expected date:  Expected time:  Means of arrival:  Comments: [facility] 

## 2015-09-07 NOTE — ED Notes (Signed)
Patient transported to CT 

## 2015-09-07 NOTE — H&P (Signed)
[facility] History and Physical  Brennon Otterness CXK:[id number] DOB: Mar 16, 1951 DOA: 09/07/2015  Referring physician: ED physician PCP: Mabeline Caras, NP  Specialists:   Chief Complaint: Constipation, abdominal pain, nausea, vomiting, heartburn, increased urinary frequency  HPI: Luke Wiggins is a 64 y.o. male with PMH of hypertension, hyperlipidemia, hypothyroidism, depression, BPH, HCV, COPD, cancer (possible left adrenal cancer?), s/p of left adrenalectomy and nephrectomy, insomnia, possible CKD-III, who presents with constipation, abdominal pain, heartburn, increased urinary frequency.  Patient reports that he has chronic constipation, which has been worsening recently. His last bowel movement was on 09/05/15. He has nausea, and vomited twice, without blood in the vomitus. He took laxatives without help, but he had 3 bowel movements in the emergency room today. He also reports increased urinary frequency and mild burning on urination, but no dysuria. He does not have flank pain. Patient does not have chest pain, shortness of breath, cough, rashes, unilateral weakness. Patient reports that he has chronic hot burning, which becomes more severe recently.  In ED, patient was found to have negative urinalysis, lipase is 32, WBC 9.4, temperature 98.4, no tachycardia, creatinine 1.88 which was 2.02 on 08/25/15. CT-abdomen/pelvis showed Right perinephric stranding with mild right hydronephrosis and dilatation of the proximal right ureter. The caliber of the mid to distal right ureter is normal. There is a 1 mm stone in a right renal calyx extending towards the pelvis. No other renal or ureteral stones are noted. The findings could be due to pyelonephritis or recently passed stone.    Where does patient live?    Assistant living facility Can patient participate in ADLs? None  Review of Systems:   General: no fevers, chills, no changes in body weight, has poor appetite, has fatigue HEENT: no blurry  vision, hearing changes or sore throat Pulm: no dyspnea, coughing, wheezing CV: no chest pain, palpitations Abd: has nausea, vomiting, abdominal pain, constipation. No diarrhea. GU: no dysuria, has burning on urination, increased urinary frequency, no hematuria  Ext: no leg edema Neuro: no unilateral weakness, numbness, or tingling, no vision change or hearing loss Skin: no rash MSK: No muscle spasm, no deformity, no limitation of range of movement in spin Heme: No easy bruising.  Travel history: No recent long distant travel.  Allergy: No Known Allergies  Past Medical History  Diagnosis Date  . Hyponatremia   . Hypertension   . Hyperlipidemia   . BPH (benign prostatic hypertrophy)   . Hepatitis C   . COPD (chronic obstructive pulmonary disease)   . Chronic constipation   . Cancer   . Depression   . Insomnia   . Hypothyroidism     Past Surgical History  Procedure Laterality Date  . Adrenalectomy Left   . Nephrectomy Left     Social History:  reports that he has quit smoking. He does not have any smokeless tobacco history on file. He reports that he does not drink alcohol. His drug history is not on file.  Family History:  Family History  Problem Relation Age of Onset  . Family history unknown: Yes     Prior to Admission medications   Medication Sig Start Date End Date Taking? Authorizing Provider  amLODipine (NORVASC) 5 MG tablet Take 5 mg by mouth daily.   Yes Historical Provider, MD  aspirin EC 81 MG tablet Take 81 mg by mouth daily.   Yes Historical Provider, MD  calcium carbonate (TUMS - DOSED IN MG ELEMENTAL CALCIUM) 500 MG chewable tablet Chew 2 tablets by  mouth every 6 (six) hours.    Yes Historical Provider, MD  docusate sodium (COLACE) 100 MG capsule Take 200 mg by mouth daily.   Yes Historical Provider, MD  finasteride (PROSCAR) 5 MG tablet Take 5 mg by mouth daily.   Yes Historical Provider, MD  folic acid (FOLVITE) 1 MG tablet Take 1 mg by mouth daily.    Yes Historical Provider, MD  lactulose (CHRONULAC) 10 GM/15ML solution Take 30 g by mouth 2 (two) times daily.   Yes Historical Provider, MD  levothyroxine (SYNTHROID, LEVOTHROID) 25 MCG tablet Take 25 mcg by mouth daily before breakfast.   Yes Historical Provider, MD  Menthol-Methyl Salicylate (BENGAY GREASELESS EX) Apply 1 application topically 3 (three) times daily.   Yes Historical Provider, MD  mirtazapine (REMERON) 30 MG tablet Take 30 mg by mouth at bedtime.   Yes Historical Provider, MD  nepafenac (ILEVRO) 0.3 % ophthalmic suspension Place 1 drop into the left eye daily.   Yes Historical Provider, MD  polyethylene glycol (MIRALAX / GLYCOLAX) packet Take 17 g by mouth daily.   Yes Historical Provider, MD  sodium chloride 1 G tablet Take 1 g by mouth 2 (two) times daily. 8am and 2pm   Yes Historical Provider, MD  tamsulosin (FLOMAX) 0.4 MG CAPS capsule Take 0.4 mg by mouth daily.   Yes Historical Provider, MD  temazepam (RESTORIL) 30 MG capsule Take 30 mg by mouth at bedtime.    Yes Historical Provider, MD  thiamine (VITAMIN B-1) 100 MG tablet Take 100 mg by mouth daily.   Yes Historical Provider, MD  tiotropium (SPIRIVA) 18 MCG inhalation capsule Place 18 mcg into inhaler and inhale daily.   Yes Historical Provider, MD  albuterol (PROVENTIL HFA;VENTOLIN HFA) 108 (90 BASE) MCG/ACT inhaler Inhale 2 puffs into the lungs every 6 (six) hours as needed for wheezing or shortness of breath.    Historical Provider, MD  cephALEXin (KEFLEX) 500 MG capsule Take 1 capsule (500 mg total) by mouth 3 (three) times daily. Patient not taking: Reported on 09/07/2015 08/25/15   Larene Pickett, PA-C    Physical Exam: Filed Vitals:   09/07/15 2330 09/08/15 0041 09/08/15 0100 09/08/15 0215  BP: 140/91 145/92  127/87  Pulse: 82 78    Temp:  98.1 F (36.7 C)    TempSrc:  Oral    Resp: 16 20    Height:   5\' 7"  (1.702 m)   SpO2: 95% 99%     General: Not in acute distress HEENT:       Eyes: PERRL, EOMI, no  scleral icterus.       ENT: No discharge from the ears and nose, no pharynx injection, no tonsillar enlargement.        Neck: No JVD, no bruit, no mass felt. Heme: No neck lymph node enlargement. Cardiac: S1/S2, RRR, No murmurs, No gallops or rubs. Pulm:  No rales, wheezing, rhonchi or rubs. Abd: Soft, nondistended, has mild tenderness over central abdomen, no rebound pain, no organomegaly, BS present. Has two surgical scars.  Ext: No pitting leg edema bilaterally. 2+DP/PT pulse bilaterally. Musculoskeletal: No joint deformities, No joint redness or warmth, no limitation of ROM in spin. Skin: No rashes.  Neuro: Alert, oriented X3, cranial nerves II-XII grossly intact, muscle strength 5/5 in all extremities, sensation to light touch intact.  Psych: Patient is not psychotic, no suicidal or hemocidal ideation.  Labs on Admission:  Basic Metabolic Panel:  Recent Labs Lab 09/07/15 1842 09/08/15 0107  NA  130* 132*  K 4.4 3.8  CL 103 103  CO2 17* 21*  GLUCOSE 88 126*  BUN 26* 22*  CREATININE 1.88* 1.78*  CALCIUM 9.4 8.6*   Liver Function Tests:  Recent Labs Lab 09/07/15 1842  AST 17  ALT 14*  ALKPHOS 69  BILITOT 0.9  PROT 8.3*  ALBUMIN 4.0    Recent Labs Lab 09/07/15 1842  LIPASE 32   No results for input(s): AMMONIA in the last 168 hours. CBC:  Recent Labs Lab 09/07/15 1842 09/08/15 0107  WBC 9.4 12.7*  NEUTROABS 6.1  --   HGB 13.4 13.4  HCT 40.7 39.8  MCV 87.5 86.9  PLT 245 237   Cardiac Enzymes: No results for input(s): CKTOTAL, CKMB, CKMBINDEX, TROPONINI in the last 168 hours.  BNP (last 3 results) No results for input(s): BNP in the last 8760 hours.  ProBNP (last 3 results) No results for input(s): PROBNP in the last 8760 hours.  CBG: No results for input(s): GLUCAP in the last 168 hours.  Radiological Exams on Admission: Ct Abdomen Pelvis Wo Contrast  09/07/2015   CLINICAL DATA:  Chronic constipation. Abdomen pain starting this morning evaluate  for diverticulitis.  EXAM: CT ABDOMEN AND PELVIS WITHOUT CONTRAST  TECHNIQUE: Multidetector CT imaging of the abdomen and pelvis was performed following the standard protocol without IV contrast.  COMPARISON:  None.  FINDINGS: The liver, spleen, pancreas, gallbladder are normal. Patient is status post left adrenalectomy and nephrectomy. For there is atherosclerosis of the abdominal aorta without aneurysmal dilatation. There is no abdominal lymphadenopathy.  There is right perinephric stranding with mild right hydronephrosis and dilatation of the proximal right ureter. The caliber of the mid to distal right ureter is normal. There is a 1 mm stone in a right renal calyx extending towards the pelvis.  For for there is no small bowel obstruction or diverticulitis. Minimal hiatal hernia is identified.  Fluid-filled bladder is normal. There is mild scar of the right lung base. Degenerative joint changes of the spine are noted.  IMPRESSION: Right perinephric stranding with mild right hydronephrosis and dilatation of the proximal right ureter. The caliber of the mid to distal right ureter is normal. There is a 1 mm stone in a right renal calyx extending towards the pelvis. No other renal or ureteral stones are noted. The findings could be due to pyelonephritis or recently passed stone. Clinical correlation is recommended.   Electronically Signed   By: Abelardo Diesel M.D.   On: 09/07/2015 20:06    EKG: Independently reviewed.  Abnormal findings: T-wave inversion in V5-V6  Assessment/Plan Principal Problem:   Abdominal pain Active Problems:   Hydronephrosis   Hypertension   Hyperlipidemia   BPH (benign prostatic hypertrophy)   Hepatitis C   COPD (chronic obstructive pulmonary disease)   Depression   Insomnia   Hypothyroidism   CKD (chronic kidney disease) stage 3, GFR 30-59 ml/min   GERD (gastroesophageal reflux disease)   Constipation  Abdominal pain: Likely due to constipation which has resolved in  emergency room. Lipase is 32, unlikely to have pancreatitis. Patient has right hydronephrosis and a small stone which may have also contributed to abdominal pain. -will admit to med-surg bed -When necessary Zofran for nausea, morphine for pain  Hydronephrosis: CT-abdomen/pelvis showed right perinephric stranding with mild right hydronephrosis and dilatation of the proximal right ureter. There is a 1 mm stone in a right renal calyx extending towards the pelvis. Patient has negative urinalysis, no fever or chills, therefore  clinically patient does not seem to have urinary tract infection. - EDP, Dr. Billy Fischer consulted urologist on-call, who was not impressed by CT finding, commended Foley catheter placement. - ED gave one dose of Rocephin, I will hold Rocephin now and follow urine culture.  GERD: -Pepcid IV bid  HTN: -Amlodipine -Patient is also on Flomax which is for BPH  BPH: stable - Continue Flomax and Proscar  HLD: Last LDL was not on record. Not on med at home -Check FLP  COPD: Stable. No signs of acute exacerbation. -When necessary albuterol nebs -Spirava inhaler  Depression: Stable, no suicidal or homicidal ideations. -Continue home medications: Mirtazapine   Insomnia: -prn Restoril  Hypothyroidism: Last TSH was not on record -Continue home Synthroid -Check TSH  Possible CKD (chronic kidney disease) stage 3: Previous creatinine was 2.20 on 08/25/15. His creatinine is 1.88 on admission, which is stable -f/u renal functions. BMP  Constipation: Seems to be resolved. -Continue MiraLAX, lactulose, Colace  DVT ppx: SCD  Code Status: Partial (OK with CPR, but not intubation) Family Communication: None at bed side.      Disposition Plan: Admit to inpatient   Date of Service 09/08/2015    Ivor Costa [facility] Pager [phone]  If 7PM-7AM, please contact night-coverage [email or web address] Password [email or web address] 09/08/2015, 3:45 AM

## 2015-09-07 NOTE — ED Notes (Signed)
Patient had large loose brown BM.

## 2015-09-08 DIAGNOSIS — I10 Essential (primary) hypertension: Secondary | ICD-10-CM | POA: Insufficient documentation

## 2015-09-08 DIAGNOSIS — R339 Retention of urine, unspecified: Secondary | ICD-10-CM

## 2015-09-08 DIAGNOSIS — E039 Hypothyroidism, unspecified: Secondary | ICD-10-CM | POA: Insufficient documentation

## 2015-09-08 DIAGNOSIS — N183 Chronic kidney disease, stage 3 (moderate): Secondary | ICD-10-CM

## 2015-09-08 DIAGNOSIS — R1084 Generalized abdominal pain: Secondary | ICD-10-CM | POA: Insufficient documentation

## 2015-09-08 DIAGNOSIS — K5900 Constipation, unspecified: Secondary | ICD-10-CM | POA: Diagnosis present

## 2015-09-08 DIAGNOSIS — N400 Enlarged prostate without lower urinary tract symptoms: Secondary | ICD-10-CM | POA: Diagnosis not present

## 2015-09-08 DIAGNOSIS — K219 Gastro-esophageal reflux disease without esophagitis: Secondary | ICD-10-CM | POA: Diagnosis present

## 2015-09-08 LAB — PROTIME-INR
INR: 0.99 (ref 0.00–1.49)
PROTHROMBIN TIME: 13.3 s (ref 11.6–15.2)

## 2015-09-08 LAB — CBC
HCT: 39.8 % (ref 39.0–52.0)
Hemoglobin: 13.4 g/dL (ref 13.0–17.0)
MCH: 29.3 pg (ref 26.0–34.0)
MCHC: 33.7 g/dL (ref 30.0–36.0)
MCV: 86.9 fL (ref 78.0–100.0)
PLATELETS: 237 10*3/uL (ref 150–400)
RBC: 4.58 MIL/uL (ref 4.22–5.81)
RDW: 13.2 % (ref 11.5–15.5)
WBC: 12.7 10*3/uL — AB (ref 4.0–10.5)

## 2015-09-08 LAB — LIPID PANEL
CHOL/HDL RATIO: 4.6 ratio
CHOLESTEROL: 206 mg/dL — AB (ref 0–200)
HDL: 45 mg/dL (ref >40)
LDL CALC: 137 mg/dL — AB (ref 0–99)
Triglycerides: 121 mg/dL (ref <150)
VLDL: 24 mg/dL (ref 0–40)

## 2015-09-08 LAB — BASIC METABOLIC PANEL
Anion gap: 8 (ref 5–15)
BUN: 22 mg/dL — AB (ref 6–20)
CALCIUM: 8.6 mg/dL — AB (ref 8.9–10.3)
CHLORIDE: 103 mmol/L (ref 101–111)
CO2: 21 mmol/L — ABNORMAL LOW (ref 22–32)
CREATININE: 1.78 mg/dL — AB (ref 0.61–1.24)
GFR calc Af Amer: 44 mL/min — ABNORMAL LOW (ref >60)
GFR calc non Af Amer: 38 mL/min — ABNORMAL LOW (ref >60)
Glucose, Bld: 126 mg/dL — ABNORMAL HIGH (ref 65–99)
Potassium: 3.8 mmol/L (ref 3.5–5.1)
SODIUM: 132 mmol/L — AB (ref 135–145)

## 2015-09-08 LAB — TYPE AND SCREEN
ABO/RH(D): O POS
Antibody Screen: NEGATIVE

## 2015-09-08 LAB — GLUCOSE, CAPILLARY: GLUCOSE-CAPILLARY: 99 mg/dL (ref 65–99)

## 2015-09-08 LAB — ABO/RH: ABO/RH(D): O POS

## 2015-09-08 LAB — MRSA PCR SCREENING: MRSA by PCR: NEGATIVE

## 2015-09-08 LAB — TSH: TSH: 3.08 u[IU]/mL (ref 0.350–4.500)

## 2015-09-08 LAB — APTT: APTT: 29 s (ref 24–37)

## 2015-09-08 MED ORDER — INFLUENZA VAC SPLIT QUAD 0.5 ML IM SUSY: 0.5000 mL | PREFILLED_SYRINGE | INTRAMUSCULAR | Status: DC

## 2015-09-08 MED ORDER — DOCUSATE SODIUM 100 MG PO CAPS
200.0000 mg | ORAL_CAPSULE | Freq: Every day | ORAL | Status: DC
  Filled 2015-09-08: qty 2

## 2015-09-08 MED ORDER — ACETAMINOPHEN 650 MG RE SUPP: 650.0000 mg | Freq: Four times a day (QID) | RECTAL | Status: DC | PRN

## 2015-09-08 MED ORDER — ONDANSETRON HCL 4 MG/2ML IJ SOLN: 4.0000 mg | Freq: Three times a day (TID) | INTRAMUSCULAR | Status: DC | PRN

## 2015-09-08 MED ORDER — FINASTERIDE 5 MG PO TABS
5.0000 mg | ORAL_TABLET | Freq: Every day | ORAL | Status: DC
  Administered 2015-09-08: 5 mg via ORAL
  Filled 2015-09-08: qty 1

## 2015-09-08 MED ORDER — FOLIC ACID 1 MG PO TABS
1.0000 mg | ORAL_TABLET | Freq: Every day | ORAL | Status: DC
  Administered 2015-09-08: 1 mg via ORAL
  Filled 2015-09-08: qty 1

## 2015-09-08 MED ORDER — POLYETHYLENE GLYCOL 3350 17 G PO PACK
17.0000 g | PACK | Freq: Every day | ORAL | Status: DC
  Filled 2015-09-08: qty 1

## 2015-09-08 MED ORDER — ACETAMINOPHEN 325 MG PO TABS: 650.0000 mg | ORAL_TABLET | Freq: Four times a day (QID) | ORAL | Status: DC | PRN

## 2015-09-08 MED ORDER — HYDROCODONE-ACETAMINOPHEN 5-325 MG PO TABS: 1.0000 | ORAL_TABLET | Freq: Four times a day (QID) | ORAL | Status: DC | PRN

## 2015-09-08 MED ORDER — TIOTROPIUM BROMIDE MONOHYDRATE 18 MCG IN CAPS
18.0000 ug | ORAL_CAPSULE | Freq: Every day | RESPIRATORY_TRACT | Status: DC
  Administered 2015-09-08: 18 ug via RESPIRATORY_TRACT
  Filled 2015-09-08: qty 5

## 2015-09-08 MED ORDER — TEMAZEPAM 15 MG PO CAPS
30.0000 mg | ORAL_CAPSULE | Freq: Every day | ORAL | Status: DC
Start: 2015-09-08 — End: 2015-09-08
  Administered 2015-09-08: 30 mg via ORAL
  Filled 2015-09-08: qty 2

## 2015-09-08 MED ORDER — INFLUENZA VAC SPLIT QUAD 0.5 ML IM SUSY
0.5000 mL | PREFILLED_SYRINGE | Freq: Once | INTRAMUSCULAR | Status: AC
  Administered 2015-09-08: 0.5 mL via INTRAMUSCULAR
  Filled 2015-09-08: qty 0.5

## 2015-09-08 MED ORDER — LEVOTHYROXINE SODIUM 25 MCG PO TABS
25.0000 ug | ORAL_TABLET | Freq: Every day | ORAL | Status: DC
  Administered 2015-09-08: 25 ug via ORAL
  Filled 2015-09-08 (×2): qty 1

## 2015-09-08 MED ORDER — SODIUM CHLORIDE 1 G PO TABS
1.0000 g | ORAL_TABLET | ORAL | Status: DC
  Administered 2015-09-08: 1 g via ORAL
  Filled 2015-09-08 (×3): qty 1

## 2015-09-08 MED ORDER — NEPAFENAC 0.3 % OP SUSP: 1.0000 [drp] | Freq: Every day | OPHTHALMIC | Status: DC

## 2015-09-08 MED ORDER — ASPIRIN EC 81 MG PO TBEC
81.0000 mg | DELAYED_RELEASE_TABLET | Freq: Every day | ORAL | Status: DC
  Filled 2015-09-08: qty 1

## 2015-09-08 MED ORDER — MIRTAZAPINE 30 MG PO TABS
30.0000 mg | ORAL_TABLET | Freq: Every day | ORAL | Status: DC
  Administered 2015-09-08: 30 mg via ORAL
  Filled 2015-09-08 (×2): qty 1

## 2015-09-08 MED ORDER — TAMSULOSIN HCL 0.4 MG PO CAPS
0.4000 mg | ORAL_CAPSULE | Freq: Every day | ORAL | Status: DC
  Administered 2015-09-08: 0.4 mg via ORAL
  Filled 2015-09-08: qty 1

## 2015-09-08 MED ORDER — LACTULOSE 10 GM/15ML PO SOLN
30.0000 g | Freq: Two times a day (BID) | ORAL | Status: DC
  Administered 2015-09-08: 30 g via ORAL
  Filled 2015-09-08 (×3): qty 45

## 2015-09-08 MED ORDER — MORPHINE SULFATE (PF) 2 MG/ML IV SOLN: 2.0000 mg | INTRAVENOUS | Status: DC | PRN

## 2015-09-08 MED ORDER — VITAMIN B-1 100 MG PO TABS
100.0000 mg | ORAL_TABLET | Freq: Every day | ORAL | Status: DC
  Administered 2015-09-08: 100 mg via ORAL
  Filled 2015-09-08: qty 1

## 2015-09-08 MED ORDER — MUSCLE RUB 10-15 % EX CREA
1.0000 "application " | TOPICAL_CREAM | Freq: Three times a day (TID) | CUTANEOUS | Status: DC
  Administered 2015-09-08 (×2): 1 via TOPICAL
  Filled 2015-09-08: qty 85

## 2015-09-08 MED ORDER — AMLODIPINE BESYLATE 5 MG PO TABS
5.0000 mg | ORAL_TABLET | Freq: Every day | ORAL | Status: DC
  Administered 2015-09-08: 5 mg via ORAL
  Filled 2015-09-08: qty 1

## 2015-09-08 MED ORDER — ALUM & MAG HYDROXIDE-SIMETH 200-200-20 MG/5ML PO SUSP: 30.0000 mL | Freq: Four times a day (QID) | ORAL | Status: DC | PRN

## 2015-09-08 MED ORDER — ALBUTEROL SULFATE (2.5 MG/3ML) 0.083% IN NEBU: 2.5000 mg | INHALATION_SOLUTION | RESPIRATORY_TRACT | Status: DC | PRN

## 2015-09-08 MED ORDER — FAMOTIDINE IN NACL 20-0.9 MG/50ML-% IV SOLN
20.0000 mg | Freq: Two times a day (BID) | INTRAVENOUS | Status: DC
  Administered 2015-09-08 (×2): 20 mg via INTRAVENOUS
  Filled 2015-09-08 (×4): qty 50

## 2015-09-08 MED ORDER — CALCIUM CARBONATE ANTACID 500 MG PO CHEW
2.0000 | CHEWABLE_TABLET | Freq: Four times a day (QID) | ORAL | Status: DC
  Administered 2015-09-08 (×3): 400 mg via ORAL
  Filled 2015-09-08 (×8): qty 2

## 2015-09-08 MED ORDER — SODIUM CHLORIDE 0.9 % IV SOLN
INTRAVENOUS | Status: DC
  Administered 2015-09-08: 01:00:00 via INTRAVENOUS

## 2015-09-08 NOTE — Clinical Social Work Note (Signed)
Clinical Social Work Assessment  Patient Details  Name: Luke Wiggins MRN: [id number] Date of Birth: 20-Jan-1951  Date of referral:  09/08/15               Reason for consult:  Discharge Planning                Permission sought to share information with:  Facility Art therapist granted to share information::  No, Yes, Verbal Permission Granted  Name::        Agency::  [facility]  Relationship::     Contact Information:     Housing/Transportation Living arrangements for the past 2 months:  [place] of Information:  Patient Patient Interpreter Needed:  None Criminal Activity/Legal Involvement Pertinent to Current Situation/Hospitalization:  No - Comment as needed Significant Relationships:  None Lives with:  Facility Resident Do you feel safe going back to the place where you live?  Yes Need for family participation in patient care:  No (Coment)  Care giving concerns:  PT/OT recommends HH at ALF   Social Worker assessment / plan:  CSW received referral in order to assist with DC planning. Patient is from [facility] and plans to return today. CSW faxed FL2 and DC summary to ALF who is agreeable to accept. Patient reports he would prefer to return to via [facility] and is aware of no guarantee of payment. Patient reports he will inform family of DC plans and CSW does not need to contact anyone. CSW prepared DC packet with FL2 and DC summary included.  RN set up [facility] PT/OT/RN through [facility] per ALF request.  PTAR arranged; request #: [id number].  CSW is signing off but available if needed.   Employment status:  Disabled (Comment on whether or not currently receiving Disability) Insurance information:  Medicaid In [place], [place] PT Recommendations:  Home with Man / Referral to community resources:  Other (Comment Required) (Will return to ALF)  Patient/Family's Response to care:  Patient engaged during  assessment.  Patient/Family's Understanding of and Emotional Response to Diagnosis, Current Treatment, and Prognosis:  Patient reports he is glad that he just needed a check-up and that he can return to ALF today.  Emotional Assessment Appearance:  Appears stated age Attitude/Demeanor/Rapport:  Other (Cooperative) Affect (typically observed):  Appropriate Orientation:  Oriented to Self, Oriented to Place, Oriented to  Time, Oriented to Situation Alcohol / Substance use:  Not Applicable Psych involvement (Current and /or in the community):  No (Comment)  Discharge Needs  Concerns to be addressed:  No discharge needs identified Readmission within the last 30 days:  No Current discharge risk:  None Barriers to Discharge:  No Barriers Identified   Boone Master, [place] 09/08/2015, 2:56 PM [phone]

## 2015-09-08 NOTE — Evaluation (Signed)
Physical Therapy Evaluation Patient Details Name: Luke Wiggins MRN: [id number] DOB: [birth year] Today's Date: 09/08/2015   History of Present Illness  pt was admitted with abdominal pain, nausea, urinary frequency, and constipation.  H/O BPH, HTN, COPD, cancer  Clinical Impression  Patient evaluated by Physical Therapy with no further acute PT needs identified. All education has been completed and the patient has no further questions. See below for any follow-up Physial Therapy or equipment needs. PT is signing off. Thank you for this referral.    Follow Up Recommendations Home health PT    Equipment Recommendations  None recommended by PT    Recommendations for Other Services       Precautions / Restrictions Precautions Precautions: Fall Restrictions Weight Bearing Restrictions: No      Mobility  Bed Mobility Overal bed mobility: Modified Independent             General bed mobility comments: extra time  Transfers Overall transfer level: Needs assistance   Transfers: Sit to/from Stand Sit to Stand: Supervision;Min guard         General transfer comment: cues for safety, use of RW  Ambulation/Gait Ambulation/Gait assistance: Min guard Ambulation Distance (Feet): 200 Feet Assistive device: Rolling walker (2 wheeled) Gait Pattern/deviations: Step-through pattern;Trunk flexed     General Gait Details: walks at fast pace, LOB x 1 with min/guard recovery; pt likes to wear Crocs (discussed this potentially causing fall or LOB)  Stairs            Wheelchair Mobility    Modified Rankin (Stroke Patients Only)       Balance Overall balance assessment: Needs assistance     Sitting balance - Comments: pt had bed support when he stood for adls; min A for balance for transfer   Standing balance support: Single extremity supported;Bilateral upper extremity supported;During functional activity Standing balance-Leahy Scale: Good Standing balance comment:  Fair+ to good, pt able to tol small perturbations only without LOB, repeated standing attempts indicate he is at risk for falls               High Level Balance Comments: uses bed for support on LEs when coming to stand, 3attempts to come to stand; LOB x1 during gait with RW with min/guard recovery             Pertinent Vitals/Pain Pain Assessment: No/denies pain    Home Living Family/patient expects to be discharged to:: [facility]isted living [facility])               Home Equipment: Walker - 4 wheels      Prior Function Level of Independence: Independent         Comments: mostly washes in bed; walks to dining hall (approx 30', 2x a day) no device     Hand Dominance        Extremity/Trunk Assessment   Upper Extremity Assessment: Defer to OT evaluation;Overall WFL for tasks assessed           Lower Extremity Assessment: Overall WFL for tasks assessed         Communication   Communication: No difficulties  Cognition Arousal/Alertness: Awake/alert Behavior During Therapy: WFL for tasks assessed/performed Overall Cognitive Status: Within Functional Limits for tasks assessed (no family present )                      General Comments      Exercises        Assessment/Plan  PT Assessment All further PT needs can be met in the next venue of care  PT Diagnosis Difficulty walking   PT Problem List    PT Treatment Interventions     PT Goals (Current goals can be found in the Care Plan section) Acute Rehab PT Goals Patient Stated Goal: get strength back PT Goal Formulation: All assessment and education complete, DC therapy    Frequency     Barriers to discharge        Co-evaluation               End of Session Equipment Utilized During Treatment: Gait belt Activity Tolerance: Patient tolerated treatment well Patient left: in bed;with call bell/phone within reach      Functional Assessment Tool Used: clinical  judgement Functional Limitation: Mobility: Walking and moving around Mobility: Walking and Moving Around Current Status (L8921): At least 1 percent but less than 20 percent impaired, limited or restricted Mobility: Walking and Moving Around Goal Status [phone]): At least 1 percent but less than 20 percent impaired, limited or restricted Mobility: Walking and Moving Around Discharge Status [phone]): At least 1 percent but less than 20 percent impaired, limited or restricted    Time: 1123-1140 PT Time Calculation (min) (ACUTE ONLY): 17 min   Charges:   PT Evaluation $Initial PT Evaluation Tier I: 1 Procedure     PT G Codes:   PT G-Codes **NOT FOR INPATIENT CLASS** Functional Assessment Tool Used: clinical judgement Functional Limitation: Mobility: Walking and moving around Mobility: Walking and Moving Around Current Status (G8185): At least 1 percent but less than 20 percent impaired, limited or restricted Mobility: Walking and Moving Around Goal Status [phone]): At least 1 percent but less than 20 percent impaired, limited or restricted Mobility: Walking and Moving Around Discharge Status [phone]): At least 1 percent but less than 20 percent impaired, limited or restricted    [facility] 09/08/2015, 11:45 AM

## 2015-09-08 NOTE — Progress Notes (Signed)
Performed bladder scan prior urethral catheter insertion. Bladder scan result was 462 cc. Foley catheter inserted with no difficulties. There was 300cc clear urine immediately relieved.

## 2015-09-08 NOTE — Progress Notes (Signed)
Patient ID: Luke Wiggins, male   DOB: November 27, 1951, 64 y.o.   MRN: [id number]  I was called early this morning around 12:30 am by the ER physician, Dr. Billy Fischer, regarding this patient for a non-urgent consult regarding hydronephrosis in a solitary kidney. The patient was producing urine and his Cr was stable compared to 8/25.  I did review his CT scan and disagree with the radiologist's interpretation on the following: 1) I do not appreciate significant hydronephrosis or hydroureter. 2) There does not appear to be a 1 mm urinary tract calculus in a renal calyx (there exists no such entity as a "renal calyx extending toward the renal pelvis").  Rather this calcification appears to be a vascular calcification. 3) The bladder is not "normal" but rather appears very distended consistent with urinary retention.  The patient has a history of BPH on alpha blocker therapy and 5 alpha reductase inhibitors.  My impression was that the patient had urinary retention and I recommended he have a Foley catheter placed as this was a likely source of the patient's pain and catheter placement would likely help to resolve his pain and avoid a potentially unnecessary hospital admission. If he does appear to be in urinary retention, I recommend discharge home with a catheter with outpatient urology follow up.

## 2015-09-08 NOTE — Discharge Instructions (Signed)
Follow with Mabeline Caras, NP in 5-7 days  Please get a complete blood count and chemistry panel checked by your Primary MD at your next visit, and again as instructed by your Primary MD. Please get your medications reviewed and adjusted by your Primary MD.  Please request your Primary MD to go over all Hospital Tests and Procedure/Radiological results at the follow up, please get all Hospital records sent to your Prim MD by signing hospital release before you go home.  If you had Pneumonia of Lung problems at the Hospital: Please get a 2 view Chest X ray done in 6-8 weeks after hospital discharge or sooner if instructed by your Primary MD.  If you have Congestive Heart Failure: Please call your Cardiologist or Primary MD anytime you have any of the following symptoms:  1) 3 pound weight gain in 24 hours or 5 pounds in 1 week  2) shortness of breath, with or without a dry hacking cough  3) swelling in the hands, feet or stomach  4) if you have to sleep on extra pillows at night in order to breathe  Follow cardiac low salt diet and 1.5 lit/day fluid restriction.  If you have diabetes Accuchecks 4 times/day, Once in AM empty stomach and then before each meal. Log in all results and show them to your primary doctor at your next visit. If any glucose reading is under 80 or above 300 call your primary MD immediately.  If you have Seizure/Convulsions/Epilepsy: Please do not drive, operate heavy machinery, participate in activities at heights or participate in high speed sports until you have seen by Primary MD or a Neurologist and advised to do so again.  If you had Gastrointestinal Bleeding: Please ask your Primary MD to check a complete blood count within one week of discharge or at your next visit. Your endoscopic/colonoscopic biopsies that are pending at the time of discharge, will also need to followed by your Primary MD.  Get Medicines reviewed and adjusted. Please take all your  medications with you for your next visit with your Primary MD  Please request your Primary MD to go over all hospital tests and procedure/radiological results at the follow up, please ask your Primary MD to get all Hospital records sent to his/her office.  If you experience worsening of your admission symptoms, develop shortness of breath, life threatening emergency, suicidal or homicidal thoughts you must seek medical attention immediately by calling 911 or calling your MD immediately  if symptoms less severe.  You must read complete instructions/literature along with all the possible adverse reactions/side effects for all the Medicines you take and that have been prescribed to you. Take any new Medicines after you have completely understood and accpet all the possible adverse reactions/side effects.   Do not drive or operate heavy machinery when taking Pain medications.   Do not take more than prescribed Pain, Sleep and Anxiety Medications  Special Instructions: If you have smoked or chewed Tobacco  in the last 2 yrs please stop smoking, stop any regular Alcohol  and or any Recreational drug use.  Wear Seat belts while driving.  Please note You were cared for by a hospitalist during your hospital stay. If you have any questions about your discharge medications or the care you received while you were in the hospital after you are discharged, you can call the unit and asked to speak with the hospitalist on call if the hospitalist that took care of you is not available. Once  you are discharged, your primary care physician will handle any further medical issues. Please note that NO REFILLS for any discharge medications will be authorized once you are discharged, as it is imperative that you return to your primary care physician (or establish a relationship with a primary care physician if you do not have one) for your aftercare needs so that they can reassess your need for medications and monitor your  lab values.  You can reach the hospitalist office at phone [phone] or fax [phone]   If you do not have a primary care physician, you can call [phone] for a physician referral.  Activity: As tolerated with Full fall precautions use walker/cane & assistance as needed  Diet: regular  Disposition Home

## 2015-09-08 NOTE — Discharge Summary (Addendum)
Physician Discharge Summary  Luke Wiggins KPT:[id number] DOB: [birth year] DOA: 09/07/2015  PCP: Mabeline Caras, NP  Admit date: 09/07/2015 Discharge date: 09/08/2015  Time spent: > 30 minutes  Recommendations for Outpatient Follow-up:  1. Follow up with Mabeline Caras, NP in 1-2 weeks 2. Follow up with outpatient urologist in [place] in 1 week (patient states he has an appointment)  Discharge Diagnoses:  Principal Problem:   Abdominal pain Active Problems:   Hydronephrosis   Hypertension   Hyperlipidemia   BPH (benign prostatic hypertrophy)   Hepatitis C   COPD (chronic obstructive pulmonary disease)   Depression   Insomnia   Hypothyroidism   CKD (chronic kidney disease) stage 3, GFR 30-59 ml/min   GERD (gastroesophageal reflux disease)   Constipation  Discharge Condition: stable  Diet recommendation: regular  History of present illness:  Luke Wiggins is a 64 y.o. male with PMH of hypertension, hyperlipidemia, hypothyroidism, depression, BPH, HCV, COPD, cancer (possible left adrenal cancer?), s/p of left adrenalectomy and nephrectomy, insomnia, possible CKD-III, who presents with constipation, abdominal pain, heartburn, increased urinary frequency. Patient reports that he has chronic constipation, which has been worsening recently. His last bowel movement was on 09/05/15. He has nausea, and vomited twice, without blood in the vomitus. He took laxatives without help, but he had 3 bowel movements in the emergency room today. He also reports increased urinary frequency and mild burning on urination, but no dysuria. He does not have flank pain. Patient does not have chest pain, shortness of breath, cough, rashes, unilateral weakness. Patient reports that he has chronic hot burning, which becomes more severe recently. In ED, patient was found to have negative urinalysis, lipase is 32, WBC 9.4, temperature 98.4, no tachycardia, creatinine 1.88 which was 2.02 on 08/25/15. CT-abdomen/pelvis  showed Right perinephric stranding with mild right hydronephrosis and dilatation of the proximal right ureter. The caliber of the mid to distal right ureter is normal. There is a 1 mm stone in a right renal calyx extending towards the pelvis. No other renal or ureteral stones are noted. The findings could be due to pyelonephritis or recently passed stone.   Hospital Course:  Patient was admitted to the hospital with abdominal pain, likely in the setting of chronic constipation and urinary retention. He underwent a CT of abdomen and pelvis which showed right perinephric stranding with mild right hydronephrosis and dilatation of the proximal right ureter. There is a 1 mm stone in a right renal calyx extending towards the pelvis. Patient has negative urinalysis, no fever or chills, therefore clinically patient does not seem to have urinary tract infection. Urology was called and on independent review did not feel like the CT scan shows significant hydronephrosis but rather a distended bladder due to urinary retention. A foley catheter was placed with improvement of his urinary output and improvement in his renal function. He was recently seen by his urologist ad underwent a "procedure" to "revove polyps" about a week ago and has follow up in another week. All his other medical problems have remained stable and he was discharged without changes to his chronic medication regimen.   Procedures:  None    Consultations:  Urology   Discharge Exam: Filed Vitals:   09/08/15 0100 09/08/15 0215 09/08/15 0525 09/08/15 0936  BP:  127/87 133/86   Pulse:   77 77  Temp:   98.4 F (36.9 C)   TempSrc:   Oral   Resp:   20 18  Height: 5\' 7"  (1.702 m)  SpO2:   97% 98%    General: NAD Cardiovascular: RRR Respiratory: CTA biL  Discharge Instructions     Medication List    STOP taking these medications        cephALEXin 500 MG capsule  Commonly known as:  KEFLEX      TAKE these medications          albuterol 108 (90 BASE) MCG/ACT inhaler  Commonly known as:  PROVENTIL HFA;VENTOLIN HFA  Inhale 2 puffs into the lungs every 6 (six) hours as needed for wheezing or shortness of breath.     amLODipine 5 MG tablet  Commonly known as:  NORVASC  Take 5 mg by mouth daily.     aspirin EC 81 MG tablet  Take 81 mg by mouth daily.     BENGAY GREASELESS EX  Apply 1 application topically 3 (three) times daily.     calcium carbonate 500 MG chewable tablet  Commonly known as:  TUMS - dosed in mg elemental calcium  Chew 2 tablets by mouth every 6 (six) hours.     docusate sodium 100 MG capsule  Commonly known as:  COLACE  Take 200 mg by mouth daily.     finasteride 5 MG tablet  Commonly known as:  PROSCAR  Take 5 mg by mouth daily.     folic acid 1 MG tablet  Commonly known as:  FOLVITE  Take 1 mg by mouth daily.     ILEVRO 0.3 % ophthalmic suspension  Generic drug:  nepafenac  Place 1 drop into the left eye daily.     lactulose 10 GM/15ML solution  Commonly known as:  CHRONULAC  Take 30 g by mouth 2 (two) times daily.     levothyroxine 25 MCG tablet  Commonly known as:  SYNTHROID, LEVOTHROID  Take 25 mcg by mouth daily before breakfast.     mirtazapine 30 MG tablet  Commonly known as:  REMERON  Take 30 mg by mouth at bedtime.     polyethylene glycol packet  Commonly known as:  MIRALAX / GLYCOLAX  Take 17 g by mouth daily.     sodium chloride 1 G tablet  Take 1 g by mouth 2 (two) times daily. 8am and 2pm     tamsulosin 0.4 MG Caps capsule  Commonly known as:  FLOMAX  Take 0.4 mg by mouth daily.     temazepam 30 MG capsule  Commonly known as:  RESTORIL  Take 30 mg by mouth at bedtime.     thiamine 100 MG tablet  Commonly known as:  VITAMIN B-1  Take 100 mg by mouth daily.     tiotropium 18 MCG inhalation capsule  Commonly known as:  SPIRIVA  Place 18 mcg into inhaler and inhale daily.       Follow-up Information    Follow up with [facility].  Schedule an appointment as soon as possible for a visit in 1 week.      The results of significant diagnostics from this hospitalization (including imaging, microbiology, ancillary and laboratory) are listed below for reference.    Significant Diagnostic Studies: Ct Abdomen Pelvis Wo Contrast  09/07/2015   CLINICAL DATA:  Chronic constipation. Abdomen pain starting this morning evaluate for diverticulitis.  EXAM: CT ABDOMEN AND PELVIS WITHOUT CONTRAST  TECHNIQUE: Multidetector CT imaging of the abdomen and pelvis was performed following the standard protocol without IV contrast.  COMPARISON:  None.  FINDINGS: The liver, spleen, pancreas, gallbladder are normal. Patient is  status post left adrenalectomy and nephrectomy. For there is atherosclerosis of the abdominal aorta without aneurysmal dilatation. There is no abdominal lymphadenopathy.  There is right perinephric stranding with mild right hydronephrosis and dilatation of the proximal right ureter. The caliber of the mid to distal right ureter is normal. There is a 1 mm stone in a right renal calyx extending towards the pelvis.  For for there is no small bowel obstruction or diverticulitis. Minimal hiatal hernia is identified.  Fluid-filled bladder is normal. There is mild scar of the right lung base. Degenerative joint changes of the spine are noted.  IMPRESSION: Right perinephric stranding with mild right hydronephrosis and dilatation of the proximal right ureter. The caliber of the mid to distal right ureter is normal. There is a 1 mm stone in a right renal calyx extending towards the pelvis. No other renal or ureteral stones are noted. The findings could be due to pyelonephritis or recently passed stone. Clinical correlation is recommended.   Electronically Signed   By: Abelardo Diesel M.D.   On: 09/07/2015 20:06   Dg Abd 1 View  08/25/2015   CLINICAL DATA:  Difficulty urinating and constipation.  EXAM: ABDOMEN - 1 VIEW  COMPARISON:  None.  FINDINGS:  Bowel gas pattern appears nonobstructive. No evidence of soft tissue mass or abnormal fluid collection seen. Bladder does, however, appear moderately distended within the pelvis. No evidence of free intraperitoneal air.  Surgical sutures are seen along the midline abdomen and pelvis. Mild degenerative change noted within the lower lumbar spine. No acute osseous abnormality seen.  IMPRESSION: Nonobstructive bowel gas pattern and no evidence of acute intra-abdominal abnormality.  Bladder appears to be at least moderately distended within the pelvis, compatible with given clinical data of difficulty urinating.   Electronically Signed   By: Franki Cabot M.D.   On: 08/25/2015 16:43    Microbiology: Recent Results (from the past 240 hour(s))  MRSA PCR Screening     Status: None   Collection Time: 09/08/15  1:41 AM  Result Value Ref Range Status   MRSA by PCR NEGATIVE NEGATIVE Final    Comment:        The GeneXpert MRSA Assay (FDA approved for NASAL specimens only), is one component of a comprehensive MRSA colonization surveillance program. It is not intended to diagnose MRSA infection nor to guide or monitor treatment for MRSA infections.      Labs: Basic Metabolic Panel:  Recent Labs Lab 09/07/15 1842 09/08/15 0107  NA 130* 132*  K 4.4 3.8  CL 103 103  CO2 17* 21*  GLUCOSE 88 126*  BUN 26* 22*  CREATININE 1.88* 1.78*  CALCIUM 9.4 8.6*   Liver Function Tests:  Recent Labs Lab 09/07/15 1842  AST 17  ALT 14*  ALKPHOS 69  BILITOT 0.9  PROT 8.3*  ALBUMIN 4.0    Recent Labs Lab 09/07/15 1842  LIPASE 32   CBC:  Recent Labs Lab 09/07/15 1842 09/08/15 0107  WBC 9.4 12.7*  NEUTROABS 6.1  --   HGB 13.4 13.4  HCT 40.7 39.8  MCV 87.5 86.9  PLT 245 237   CBG:  Recent Labs Lab 09/08/15 0741  GLUCAP 99     Signed:  GHERGHE, COSTIN  [facility] 09/08/2015, 2:11 PM

## 2015-09-08 NOTE — Evaluation (Signed)
Occupational Therapy Evaluation Patient Details Name: Luke Wiggins MRN: [id number] DOB: [birth year] Today's Date: 09/08/2015    History of Present Illness pt was admitted with abdominal pain, nausea, urinary frequency, and constipation.  H/O BPH, HTN, COPD, cancer   Clinical Impression   This 64 year old man was admitted for the above.  He lives at [place] and has assistance with medication; otherwise, he reports he is independent.  He will benefit from skilled OT to increase safety and independence with adls.  Goals in acute are for supervision level    Follow Up Recommendations  Home health OT;Supervision - Intermittent (supervision for all mobility'; SNF if ALF can't provide)    Equipment Recommendations  None recommended by OT    Recommendations for Other Services       Precautions / Restrictions Precautions Precautions: Fall Restrictions Weight Bearing Restrictions: No      Mobility Bed Mobility Overal bed mobility: Modified Independent             General bed mobility comments: extra time  Transfers Overall transfer level: Needs assistance   Transfers: Sit to/from Stand Sit to Stand: Min guard         General transfer comment: bed was against back of legs when pt stood    Balance Overall balance assessment: Needs assistance     Sitting balance - Comments: pt had bed support when he stood for adls; min A for balance for transfer                                    ADL Overall ADL's : Needs assistance/impaired                         Toilet Transfer: Minimal assistance;Stand-pivot (to recliner)             General ADL Comments: Pt performed ADL from EOB, min guard for balance for LB and set up for UB adls.  Pt needs min A for transfers due to decreased balance     Vision     Perception     Praxis      Pertinent Vitals/Pain Pain Assessment: No/denies pain     Hand Dominance     Extremity/Trunk Assessment Upper  Extremity Assessment Upper Extremity Assessment: Overall WFL for tasks assessed           Communication Communication Communication: No difficulties   Cognition Arousal/Alertness: Awake/alert Behavior During Therapy: WFL for tasks assessed/performed Overall Cognitive Status: Within Functional Limits for tasks assessed                     General Comments       Exercises       Shoulder Instructions      Home Living Family/patient expects to be discharged to:: Assisted living [facility])                                        Prior Functioning/Environment Level of Independence: Independent        Comments: mostly washes in bed; walks to dining hall, no device    OT Diagnosis: Generalized weakness   OT Problem List: Decreased strength;Impaired balance (sitting and/or standing);Pain   OT Treatment/Interventions: Self-care/ADL training;DME and/or AE instruction;Patient/family education;Balance training    OT Goals(Current  goals can be found in the care plan section) Acute Rehab OT Goals Patient Stated Goal: get strength back OT Goal Formulation: With patient Time For Goal Achievement: 09/15/15 Potential to Achieve Goals: Good ADL Goals Pt Will Perform Grooming: with supervision;standing Pt Will Transfer to Toilet: with supervision;ambulating;regular height toilet Additional ADL Goal #1: pt will complete ADL safely at set up level, sit to stand  OT Frequency: Min 2X/week   Barriers to D/C:            Co-evaluation              End of Session Nurse Communication:  (RN said no chair alarm:  spoke to NT and recommended she get)  Activity Tolerance: Patient tolerated treatment well Patient left: in chair;with call bell/phone within reach   Time: 0846-0905 OT Time Calculation (min): 19 min Charges:  OT General Charges $OT Visit: 1 Procedure OT Evaluation $Initial OT Evaluation Tier I: 1 Procedure G-Codes: OT G-codes **NOT FOR  INPATIENT CLASS** Functional Assessment Tool Used: clinical observation Functional Limitation: Self care Self Care Current Status (E7614): At least 20 percent but less than 40 percent impaired, limited or restricted Self Care Goal Status (J0929): At least 1 percent but less than 20 percent impaired, limited or restricted  [facility] 09/08/2015, 9:29 AM Lesle Chris, OTR/L [phone] 09/08/2015

## 2015-09-08 NOTE — Care Management Note (Signed)
Case Management Note  Patient Details  Name: Delmas Faucett MRN: [id number] Date of Birth: [birth year]  Subjective/Objective:              Abd. pain      Action/Plan:     CODE 32 GIVEN TO PATIENT AND SIGNED.  COPY TO PATIENT ORGINIAL IN THE SHADOW CHART   Expected Discharge Date:                  Expected Discharge Plan:     In-House Referral:     Discharge planning Services     Post Acute Care Choice:    Choice offered to:     DME Arranged:    DME Agency:     HH Arranged:    [place] Agency:     Status of Service:     Medicare Important Message Given:    Date Medicare IM Given:    Medicare IM give by:    Date Additional Medicare IM Given:    Additional Medicare Important Message give by:     If discussed at [place] of Stay Meetings, dates discussed:    Additional Comments:  Leeroy Cha, RN 09/08/2015, 11:25 AM

## 2015-09-09 LAB — URINE CULTURE: CULTURE: NO GROWTH

## 2015-10-04 ENCOUNTER — Emergency Department (HOSPITAL_COMMUNITY)
Admission: EM | Admit: 2015-10-04 | Discharge: 2015-10-05 | Disposition: A | Discharge status: Skilled Nursing Facility | Payer: Medicare Other | Attending: Emergency Medicine | Admitting: Emergency Medicine

## 2015-10-04 ENCOUNTER — Encounter (HOSPITAL_COMMUNITY): Payer: Self-pay | Admitting: Emergency Medicine

## 2015-10-04 DIAGNOSIS — N400 Enlarged prostate without lower urinary tract symptoms: Secondary | ICD-10-CM | POA: Insufficient documentation

## 2015-10-04 DIAGNOSIS — Z79899 Other long term (current) drug therapy: Secondary | ICD-10-CM | POA: Diagnosis not present

## 2015-10-04 DIAGNOSIS — Z7982 Long term (current) use of aspirin: Secondary | ICD-10-CM | POA: Insufficient documentation

## 2015-10-04 DIAGNOSIS — I10 Essential (primary) hypertension: Secondary | ICD-10-CM | POA: Diagnosis not present

## 2015-10-04 DIAGNOSIS — Z720 Tobacco use: Secondary | ICD-10-CM | POA: Diagnosis not present

## 2015-10-04 DIAGNOSIS — E871 Hypo-osmolality and hyponatremia: Secondary | ICD-10-CM | POA: Diagnosis not present

## 2015-10-04 DIAGNOSIS — G4700 Insomnia, unspecified: Secondary | ICD-10-CM | POA: Diagnosis not present

## 2015-10-04 DIAGNOSIS — E039 Hypothyroidism, unspecified: Secondary | ICD-10-CM | POA: Diagnosis not present

## 2015-10-04 DIAGNOSIS — F329 Major depressive disorder, single episode, unspecified: Secondary | ICD-10-CM | POA: Diagnosis not present

## 2015-10-04 DIAGNOSIS — J449 Chronic obstructive pulmonary disease, unspecified: Secondary | ICD-10-CM | POA: Insufficient documentation

## 2015-10-04 DIAGNOSIS — K5909 Other constipation: Secondary | ICD-10-CM

## 2015-10-04 DIAGNOSIS — Z8619 Personal history of other infectious and parasitic diseases: Secondary | ICD-10-CM | POA: Diagnosis not present

## 2015-10-04 DIAGNOSIS — R682 Dry mouth, unspecified: Secondary | ICD-10-CM | POA: Diagnosis present

## 2015-10-04 DIAGNOSIS — K5900 Constipation, unspecified: Secondary | ICD-10-CM | POA: Insufficient documentation

## 2015-10-04 NOTE — ED Notes (Signed)
Bed: WLPT2 Expected date:  Expected time:  Means of arrival:  Comments: EMS 64 yo dry mouth, hypertension

## 2015-10-04 NOTE — ED Notes (Signed)
Bed: WHALB Expected date:  Expected time:  Means of arrival:  Comments: No bed 

## 2015-10-04 NOTE — ED Notes (Signed)
Patient states he is here because he is dehydrated. Patient states he has been that way before, and he thinks it is the same. Patient states he is not thirsty but has been drinking water. Patient c/o back pain that is chronic in nature. Patient also states that his blood pressure is high, he was due to take night time medications at 2000. Patient began cursing at this nurse when he was advised he will have to be placed in the lobby to await a room.

## 2015-10-04 NOTE — ED Notes (Signed)
Per EMS, patient from [facility]. Patient reports feeling dehydrated, feeling weakness, and having a dry mouth since 0900. Patient also c/o back pain that he reports in chronic and unchanged in nature. Patient states he has been drinking water today. Patient with indwelling urinary catheter on arrival. Patient with chronic kidney disease, had surgery to remove one kidney in the past r/t cancer. Patient is alert & oriented. Patient reports to EMS that he had diarrhea today, then stated he was constipated today, then again states he had one large BM today.

## 2015-10-05 ENCOUNTER — Encounter (HOSPITAL_COMMUNITY): Payer: Self-pay | Admitting: Emergency Medicine

## 2015-10-05 DIAGNOSIS — K5900 Constipation, unspecified: Secondary | ICD-10-CM | POA: Diagnosis not present

## 2015-10-05 LAB — URINALYSIS, ROUTINE W REFLEX MICROSCOPIC
Bilirubin Urine: NEGATIVE
GLUCOSE, UA: NEGATIVE mg/dL
Ketones, ur: NEGATIVE mg/dL
Nitrite: NEGATIVE
Protein, ur: 100 mg/dL — AB
SPECIFIC GRAVITY, URINE: 1.013 (ref 1.005–1.030)
Urobilinogen, UA: 0.2 mg/dL (ref 0.0–1.0)
pH: 6.5 (ref 5.0–8.0)

## 2015-10-05 LAB — URINE MICROSCOPIC-ADD ON

## 2015-10-05 MED ORDER — POLYETHYLENE GLYCOL 3350 17 G PO PACK
17.0000 g | PACK | Freq: Every day | ORAL | Status: DC
  Filled 2015-10-05: qty 1

## 2015-10-05 MED ORDER — POLYETHYLENE GLYCOL 3350 17 GM/SCOOP PO POWD: 17.0000 g | Freq: Every day | ORAL | Status: DC

## 2015-10-05 NOTE — Discharge Instructions (Signed)
Constipation, Adult Constipation is when a person:  Poops (has a bowel movement) less than 3 times a week.  Has a hard time pooping.  Has poop that is dry, hard, or bigger than normal. HOME CARE   Eat foods with a lot of fiber in them. This includes fruits, vegetables, beans, and whole grains such as brown rice.  Avoid fatty foods and foods with a lot of sugar. This includes french fries, hamburgers, cookies, candy, and soda.  If you are not getting enough fiber from food, take products with added fiber in them (supplements).  Drink enough fluid to keep your pee (urine) clear or pale yellow.  Exercise on a regular basis, or as told by your doctor.  Go to the restroom when you feel like you need to poop. Do not hold it.  Only take medicine as told by your doctor. Do not take medicines that help you poop (laxatives) without talking to your doctor first. GET HELP RIGHT AWAY IF:   You have bright red blood in your poop (stool).  Your constipation lasts more than 4 days or gets worse.  You have belly (abdominal) or butt (rectal) pain.  You have thin poop (as thin as a pencil).  You lose weight, and it cannot be explained. MAKE SURE YOU:   Understand these instructions.  Will watch your condition.  Will get help right away if you are not doing well or get worse.   This information is not intended to replace advice given to you by your health care provider. Make sure you discuss any questions you have with your health care provider.   Document Released: 06/04/2008 Document Revised: 01/07/2015 Document Reviewed: 09/28/2013 Elsevier Interactive Patient Education 2016 [employer]  

## 2015-10-05 NOTE — ED Provider Notes (Signed)
CSN: [id number]   Arrival date & time 10/04/15 1934  History  By signing my name below, I, [name], attest that this documentation has been prepared under the direction and in the presence of [name], MD. Electronically Signed: [name], ED Scribe. 10/05/2015. 12:12 AM.  Chief Complaint  Patient presents with  . dry mouth     HPI Patient is a 64 y.o. male presenting with constipation.  Constipation Severity:  Mild Time since last bowel movement:  1 day Timing:  Constant Progression:  Unchanged Chronicity:  Chronic Context: dehydration   Relieved by:  Nothing Worsened by:  Nothing tried Associated symptoms: no abdominal pain, no fever and no vomiting   Risk factors: no obesity and no recent travel    Brought in by EMS from [facility]igg[facility], Luke Wiggins is a 64 y.o. male with PMHx of chronic constipation who presents to the Emergency Department complaining of chronic constipation. Pt states that he is usually given laxatives at his nursing home but has had none tonight. Last bowel movement was today. Also complains of a dry mouth.   Past Medical History  Diagnosis Date  . Hyponatremia   . Hypertension   . Hyperlipidemia   . BPH (benign prostatic hypertrophy)   . Hepatitis C   . COPD (chronic obstructive pulmonary disease) ([place])   . Chronic constipation   . Cancer ([place])   . Depression   . Insomnia   . Hypothyroidism     Past Surgical History  Procedure Laterality Date  . Adrenalectomy Left   . Nephrectomy Left     Family History  Problem Relation Age of Onset  . Family history unknown: Yes    Social History  Substance Use Topics  . Smoking status: Current Some Day Smoker    Types: Cigarettes  . Smokeless tobacco: None  . Alcohol Use: No     Review of Systems  Constitutional: Negative for fever.  HENT: Negative for drooling.        Dry mouth  Cardiovascular: Negative for chest pain.  Gastrointestinal: Positive for constipation. Negative for  vomiting and abdominal pain.  All other systems reviewed and are negative.  Home Medications   Prior to Admission medications   Medication Sig Start Date End Date Taking? Authorizing Provider  albuterol (PROVENTIL HFA;VENTOLIN HFA) 108 (90 BASE) MCG/ACT inhaler Inhale 2 puffs into the lungs every 6 (six) hours as needed for wheezing or shortness of breath.    Historical Provider, MD  amLODipine (NORVASC) 5 MG tablet Take 5 mg by mouth daily.    Historical Provider, MD  aspirin EC 81 MG tablet Take 81 mg by mouth daily.    Historical Provider, MD  calcium carbonate (TUMS - DOSED IN MG ELEMENTAL CALCIUM) 500 MG chewable tablet Chew 2 tablets by mouth every 6 (six) hours.     Historical Provider, MD  docusate sodium (COLACE) 100 MG capsule Take 200 mg by mouth daily.    Historical Provider, MD  finasteride (PROSCAR) 5 MG tablet Take 5 mg by mouth daily.    Historical Provider, MD  folic acid (FOLVITE) 1 MG tablet Take 1 mg by mouth daily.    Historical Provider, MD  lactulose (CHRONULAC) 10 GM/15ML solution Take 30 g by mouth 2 (two) times daily.    Historical Provider, MD  levothyroxine (SYNTHROID, LEVOTHROID) 25 MCG tablet Take 25 mcg by mouth daily before breakfast.    Historical Provider, MD  Menthol-Methyl Salicylate (BENGAY GREASELESS EX) Apply 1 application topically  3 (three) times daily.    Historical Provider, MD  mirtazapine (REMERON) 30 MG tablet Take 30 mg by mouth at bedtime.    Historical Provider, MD  nepafenac (ILEVRO) 0.3 % ophthalmic suspension Place 1 drop into the left eye daily.    Historical Provider, MD  polyethylene glycol (MIRALAX / GLYCOLAX) packet Take 17 g by mouth daily.    Historical Provider, MD  sodium chloride 1 G tablet Take 1 g by mouth 2 (two) times daily. 8am and 2pm    Historical Provider, MD  tamsulosin (FLOMAX) 0.4 MG CAPS capsule Take 0.4 mg by mouth daily.    Historical Provider, MD  temazepam (RESTORIL) 30 MG capsule Take 30 mg by mouth at bedtime.      Historical Provider, MD  thiamine (VITAMIN B-1) 100 MG tablet Take 100 mg by mouth daily.    Historical Provider, MD  tiotropium (SPIRIVA) 18 MCG inhalation capsule Place 18 mcg into inhaler and inhale daily.    Historical Provider, MD    Allergies  Review of patient's allergies indicates no known allergies.  Triage Vitals: BP 169/114 mmHg  Pulse 77  Temp(Src) 98 F (36.7 C) (Oral)  Resp 21  SpO2 96%  Physical Exam  Constitutional: He is oriented to person, place, and time. He appears well-developed and well-nourished. No distress.  HENT:  Head: Normocephalic.  Mouth/Throat: Oropharynx is clear and moist. No oropharyngeal exudate.  Moist mucous membranes No exudate  Eyes: Pupils are equal, round, and reactive to light.  Neck: Normal range of motion. Neck supple.  Trachea midline  Cardiovascular: Normal rate, regular rhythm and intact distal pulses.   Pulmonary/Chest: Effort normal and breath sounds normal. No respiratory distress. He has no wheezes. He has no rales.  CTAB  Abdominal: Soft. Bowel sounds are normal. He exhibits no distension and no mass. There is no tenderness. There is no rebound and no guarding.  Musculoskeletal: Normal range of motion.  Neurological: He is alert and oriented to person, place, and time. He has normal reflexes.  Skin: Skin is warm and dry.  Psychiatric: He has a normal mood and affect. His behavior is normal.  Nursing note and vitals reviewed.   ED Course  Procedures   DIAGNOSTIC STUDIES: Oxygen Saturation is 96% on RA, normal by my interpretation.    COORDINATION OF CARE: 12:08 AM Discussed treatment plan which includes Miralax and lab work with pt at bedside and pt agreed to plan.  Labs Reviewed  URINALYSIS, ROUTINE W REFLEX MICROSCOPIC (NOT AT [facility])    Imaging Review No results found.    MDM   Final diagnoses:  None    Has chronic constipation, given miralax.  PO challenged successfully actively drinking a bottle water.   Extremely moist mucus membranes, urine specific gravity is not high.  Good skin turgor.  Patient is not clinically dehydrated.  All he wants is something for his chronic constipation.  Safe for discharge at this time.     I, Valerie Cones-RASCH,Necole Minassian K, personally performed the services described in this documentation. All medical record entries made by the scribe were at my direction and in my presence.  I have reviewed the chart and discharge instructions and agree that the record reflects my personal performance and is accurate and complete. Venecia Mehl-RASCH,Vola Beneke K.  10/05/2015. 12:59 AM.      Elverda Wendel, MD 10/05/15 [phone]

## 2015-12-21 ENCOUNTER — Emergency Department (HOSPITAL_COMMUNITY)
Admission: EM | Admit: 2015-12-21 | Discharge: 2015-12-21 | Disposition: A | Discharge status: Home / Self Care | Payer: Medicare Other | Attending: Emergency Medicine | Admitting: Emergency Medicine

## 2015-12-21 ENCOUNTER — Encounter (HOSPITAL_COMMUNITY): Payer: Self-pay | Admitting: Emergency Medicine

## 2015-12-21 DIAGNOSIS — G4700 Insomnia, unspecified: Secondary | ICD-10-CM | POA: Diagnosis not present

## 2015-12-21 DIAGNOSIS — K5900 Constipation, unspecified: Secondary | ICD-10-CM | POA: Diagnosis not present

## 2015-12-21 DIAGNOSIS — Z8619 Personal history of other infectious and parasitic diseases: Secondary | ICD-10-CM | POA: Diagnosis not present

## 2015-12-21 DIAGNOSIS — R338 Other retention of urine: Secondary | ICD-10-CM

## 2015-12-21 DIAGNOSIS — R1084 Generalized abdominal pain: Secondary | ICD-10-CM | POA: Insufficient documentation

## 2015-12-21 DIAGNOSIS — F17210 Nicotine dependence, cigarettes, uncomplicated: Secondary | ICD-10-CM | POA: Diagnosis not present

## 2015-12-21 DIAGNOSIS — R339 Retention of urine, unspecified: Secondary | ICD-10-CM | POA: Insufficient documentation

## 2015-12-21 DIAGNOSIS — Z7982 Long term (current) use of aspirin: Secondary | ICD-10-CM | POA: Insufficient documentation

## 2015-12-21 DIAGNOSIS — N400 Enlarged prostate without lower urinary tract symptoms: Secondary | ICD-10-CM | POA: Insufficient documentation

## 2015-12-21 DIAGNOSIS — Z79899 Other long term (current) drug therapy: Secondary | ICD-10-CM | POA: Diagnosis not present

## 2015-12-21 DIAGNOSIS — F329 Major depressive disorder, single episode, unspecified: Secondary | ICD-10-CM | POA: Diagnosis not present

## 2015-12-21 DIAGNOSIS — I10 Essential (primary) hypertension: Secondary | ICD-10-CM | POA: Insufficient documentation

## 2015-12-21 DIAGNOSIS — E871 Hypo-osmolality and hyponatremia: Secondary | ICD-10-CM | POA: Diagnosis not present

## 2015-12-21 DIAGNOSIS — Z85528 Personal history of other malignant neoplasm of kidney: Secondary | ICD-10-CM | POA: Insufficient documentation

## 2015-12-21 DIAGNOSIS — E039 Hypothyroidism, unspecified: Secondary | ICD-10-CM | POA: Insufficient documentation

## 2015-12-21 DIAGNOSIS — Z905 Acquired absence of kidney: Secondary | ICD-10-CM | POA: Insufficient documentation

## 2015-12-21 DIAGNOSIS — R1011 Right upper quadrant pain: Secondary | ICD-10-CM | POA: Diagnosis present

## 2015-12-21 DIAGNOSIS — J449 Chronic obstructive pulmonary disease, unspecified: Secondary | ICD-10-CM | POA: Insufficient documentation

## 2015-12-21 LAB — URINALYSIS, ROUTINE W REFLEX MICROSCOPIC
Bilirubin Urine: NEGATIVE
GLUCOSE, UA: NEGATIVE mg/dL
KETONES UR: NEGATIVE mg/dL
LEUKOCYTES UA: NEGATIVE
Nitrite: NEGATIVE
PH: 6.5 (ref 5.0–8.0)
Protein, ur: 30 mg/dL — AB
Specific Gravity, Urine: 1.012 (ref 1.005–1.030)

## 2015-12-21 LAB — CBC WITH DIFFERENTIAL/PLATELET
BASOS PCT: 0 %
Basophils Absolute: 0 10*3/uL (ref 0.0–0.1)
Eosinophils Absolute: 0.3 10*3/uL (ref 0.0–0.7)
Eosinophils Relative: 3 %
HEMATOCRIT: 38.3 % — AB (ref 39.0–52.0)
Hemoglobin: 13.4 g/dL (ref 13.0–17.0)
LYMPHS PCT: 23 %
Lymphs Abs: 2.1 10*3/uL (ref 0.7–4.0)
MCH: 30 pg (ref 26.0–34.0)
MCHC: 35 g/dL (ref 30.0–36.0)
MCV: 85.9 fL (ref 78.0–100.0)
MONO ABS: 0.9 10*3/uL (ref 0.1–1.0)
MONOS PCT: 10 %
NEUTROS ABS: 5.9 10*3/uL (ref 1.7–7.7)
Neutrophils Relative %: 64 %
Platelets: 229 10*3/uL (ref 150–400)
RBC: 4.46 MIL/uL (ref 4.22–5.81)
RDW: 13.2 % (ref 11.5–15.5)
WBC: 9.2 10*3/uL (ref 4.0–10.5)

## 2015-12-21 LAB — COMPREHENSIVE METABOLIC PANEL
ALT: 18 U/L (ref 17–63)
ANION GAP: 9 (ref 5–15)
AST: 17 U/L (ref 15–41)
Albumin: 3.7 g/dL (ref 3.5–5.0)
Alkaline Phosphatase: 77 U/L (ref 38–126)
BILIRUBIN TOTAL: 0.5 mg/dL (ref 0.3–1.2)
BUN: 22 mg/dL — ABNORMAL HIGH (ref 6–20)
CO2: 20 mmol/L — ABNORMAL LOW (ref 22–32)
Calcium: 9.2 mg/dL (ref 8.9–10.3)
Chloride: 102 mmol/L (ref 101–111)
Creatinine, Ser: 1.74 mg/dL — ABNORMAL HIGH (ref 0.61–1.24)
GFR calc Af Amer: 45 mL/min — ABNORMAL LOW (ref >60)
GFR, EST NON AFRICAN AMERICAN: 39 mL/min — AB (ref >60)
Glucose, Bld: 87 mg/dL (ref 65–99)
POTASSIUM: 4.7 mmol/L (ref 3.5–5.1)
Sodium: 131 mmol/L — ABNORMAL LOW (ref 135–145)
TOTAL PROTEIN: 7.6 g/dL (ref 6.5–8.1)

## 2015-12-21 LAB — URINE MICROSCOPIC-ADD ON

## 2015-12-21 LAB — LIPASE, BLOOD: LIPASE: 82 U/L — AB (ref 11–51)

## 2015-12-21 MED ORDER — MORPHINE SULFATE (PF) 4 MG/ML IV SOLN
4.0000 mg | Freq: Once | INTRAVENOUS | Status: AC
  Administered 2015-12-21: 4 mg via INTRAVENOUS
  Filled 2015-12-21: qty 1

## 2015-12-21 MED ORDER — ONDANSETRON 4 MG PO TBDP
4.0000 mg | ORAL_TABLET | Freq: Once | ORAL | Status: AC
  Administered 2015-12-21: 4 mg via ORAL
  Filled 2015-12-21: qty 1

## 2015-12-21 MED ORDER — HYDROCODONE-ACETAMINOPHEN 5-325 MG PO TABS
2.0000 | ORAL_TABLET | Freq: Once | ORAL | Status: AC
  Administered 2015-12-21: 2 via ORAL
  Filled 2015-12-21: qty 2

## 2015-12-21 NOTE — ED Provider Notes (Signed)
3:16 PM BP 118/84 mmHg  Pulse 60  Temp(Src) 98.1 F (36.7 C) (Oral)  Resp 20  SpO2 96% Patient take in sign out from PA Pisciot[place]. Patient here w c/o Lower abdominal pain. Bening abdominal exam Denies Urinary sxs.  Hx of Kidney cancer. Obtain a post void residual and check UA.  Patient with acute urinary retention. Negative for UTI. D/c with leg bag and urology follow up. The patient appears reasonably screened and/or stabilized for discharge and I doubt any other medical condition or other [facility] requiring further screening, evaluation, or treatment in the ED at this time prior to discharge.   Margarita Mail, PA-C 12/21/15 [place], MD 12/22/15 [phone]

## 2015-12-21 NOTE — Care Management Note (Signed)
Case Management Note  Patient Details  Name: Jeanpaul Pierotti MRN: [id number] Date of Birth: Dec 07, 1951  Subjective/Objective: Patient presented to ED with urine retention.  Placed foley catheter in the ED.                     Action/Plan:Patient to be discharged back to ALF.  Patient reports he is currently active with home health services with [facility].  Patient chooses Haematologist for home health RN for foley catheter management.  Patient offered list of home health agencies by day shift EDCM.  [facility] faxed referral with face to face to [facility] with confirmation of receipt.  No further EDCM needs at this time.   Expected Discharge Date:                  Expected Discharge Plan:  Assisted Living / Rest Home  In-House Referral:     Discharge planning Services  CM Consult  Post Acute Care Choice:    Choice offered to:  Patient  DME Arranged:    DME Agency:     HH Arranged:  RN (Foley catheter management) HH Agency:  [facility]  Status of Service:  Completed, signed off  Medicare Important Message Given:    Date Medicare IM Given:    Medicare IM give by:    Date Additional Medicare IM Given:    Additional Medicare Important Message give by:     If discussed at [place] of Stay Meetings, dates discussed:    Additional CommentsLivia Snellen, RN 12/21/2015, 11:12 PM

## 2015-12-21 NOTE — ED Notes (Signed)
Reassessed patient and if they were able to urinate and patient said "he would try pee and to come back in 15-30 minutes." RN Morey Hummingbird notified, informed patient we may need to cath patient if unable to void.

## 2015-12-21 NOTE — ED Notes (Signed)
Delay in lab draw,  PA in room 

## 2015-12-21 NOTE — ED Notes (Addendum)
Per EMS pt comes from [facility] c/o right flank pain x 1 week.  Vitals: 130/90, 80hr  Pt states that he had cancer and had to have one of his kidneys removed.  Pt states that pain is worse in the mornings and afternoons.  Pt reports that he gets a pain pill in the mornings and works for couple hours but then pain returns.   Pt denies any urine problems.

## 2015-12-21 NOTE — ED Notes (Signed)
Pt requesting more pain meds before he leaves.  Made ABBie PA aware.  New orders to be placed.

## 2015-12-21 NOTE — ED Notes (Signed)
Bed: WA04 Expected date:  Expected time:  Means of arrival:  Comments: Ems-flank pain

## 2015-12-21 NOTE — Discharge Instructions (Signed)
Please follow up with [facility] in 2 days.   Acute Urinary Retention, Male Acute urinary retention is the temporary inability to urinate. This is a common problem in older men. As men age their prostates become larger and block the flow of urine from the bladder. This is usually a problem that has come on gradually.  HOME CARE INSTRUCTIONS If you are sent home with a Foley catheter and a drainage system, you will need to discuss the best course of action with your health care provider. While the catheter is in, maintain a good intake of fluids. Keep the drainage bag emptied and lower than your catheter. This is so that contaminated urine will not flow back into your bladder, which could lead to a urinary tract infection. There are two main types of drainage bags. One is a large bag that usually is used at night. It has a good capacity that will allow you to sleep through the night without having to empty it. The second type is called a leg bag. It has a smaller capacity, so it needs to be emptied more frequently. However, the main advantage is that it can be attached by a leg strap and can go underneath your clothing, allowing you the freedom to move about or leave your home. Only take over-the-counter or prescription medicines for pain, discomfort, or fever as directed by your health care provider.  SEEK MEDICAL CARE IF:  You develop a low-grade fever.  You experience spasms or leakage of urine with the spasms. SEEK IMMEDIATE MEDICAL CARE IF:   You develop chills or fever.  Your catheter stops draining urine.  Your catheter falls out.  You start to develop increased bleeding that does not respond to rest and increased fluid intake. MAKE SURE YOU:  Understand these instructions.  Will watch your condition.  Will get help right away if you are not doing well or get worse.   This information is not intended to replace advice given to you by your health care provider. Make sure  you discuss any questions you have with your health care provider.   Document Released: 03/25/2001 Document Revised: 05/03/2015 Document Reviewed: 05/28/2013 Elsevier Interactive Patient Education [employer].

## 2015-12-21 NOTE — ED Provider Notes (Signed)
The patient is a 64 year old male, he does have a prior history of injury during [place], multiple lacerations and injuries to the arm as well as intra-abdominal injuries requiring a diaper colostomy, he then developed a renal cell carcinoma according to his report and had a nephrectomy on the right, he reports he's had a small amount of right-sided flank pain and is concerned that his cancer has come back on that side. He has no other symptoms, on exam the patient has a soft nontender abdomen, no peripheral edema, he is at baseline neurologic status, clear heart and lung sounds without tachycardia or fever. Labs unremarkable thus far, urinalysis pending. 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 The patient was surprised by this, I doubt that this is a recurrent cancer problem, he can be referred for outpatient evaluation with CT scan if his symptoms continue I do not think that he has an acute intra-abdominal process at this time.  Medical screening examination/treatment/procedure(s) were conducted as a shared visit with non-physician practitioner(s) and myself.  I personally evaluated the patient during the encounter.  Clinical Impression:   Final diagnoses:  Acute urinary retention  Generalized abdominal pain         Noemi Chapel, MD 12/21/15 2114

## 2015-12-21 NOTE — ED Notes (Signed)
Patient aware we need urine. Urinal at bedside 

## 2015-12-21 NOTE — ED Notes (Signed)
[facility] notified about transport back to [facility].

## 2015-12-21 NOTE — ED Notes (Signed)
Miller MD at bedside. 

## 2015-12-21 NOTE — ED Provider Notes (Signed)
CSN: [id number]     Arrival date & time 12/21/15  1213 History   First MD Initiated Contact with Patient 12/21/15 1237     (Consider location/radiation/quality/duration/timing/severity/associated sxs/prior Treatment) HPI   Blood pressure 152/103, pulse 75, temperature 98.1 F (36.7 C), temperature source Oral, resp. rate 20, SpO2 99 %.  Luke Wiggins is a 64 y.o. male 64yo male with history of CVA, chronic constipation, left nephrectomy and adrenalectomy complaining of right sided upper abdominal pain worsening over the course of 2 weeks, normally he has a pain medication which is takes for his chronic low back pain this eases the pain however is not doing so recently. He denies fever, chills, nausea, vomiting, change in urination or defecation, is passing gas normally. This patient states  Past Medical History  Diagnosis Date  . Hyponatremia   . Hypertension   . Hyperlipidemia   . BPH (benign prostatic hypertrophy)   . Hepatitis C   . COPD (chronic obstructive pulmonary disease) ([place])   . Chronic constipation   . Cancer ([place])   . Depression   . Insomnia   . Hypothyroidism    Past Surgical History  Procedure Laterality Date  . Adrenalectomy Left   . Nephrectomy Left    Family History  Problem Relation Age of Onset  . Family history unknown: Yes   Social History  Substance Use Topics  . Smoking status: Current Some Day Smoker    Types: Cigarettes  . Smokeless tobacco: None  . Alcohol Use: No    Review of Systems  10 systems reviewed and found to be negative, except as noted in the HPI.  Allergies  Review of patient's allergies indicates no known allergies.  Home Medications   Prior to Admission medications   Medication Sig Start Date End Date Taking? Authorizing Provider  albuterol (PROVENTIL HFA;VENTOLIN HFA) 108 (90 BASE) MCG/ACT inhaler Inhale 2 puffs into the lungs every 6 (six) hours as needed for wheezing or shortness of breath.   Yes Historical Provider, MD   amLODipine (NORVASC) 5 MG tablet Take 5 mg by mouth daily.   Yes Historical Provider, MD  aspirin EC 81 MG tablet Take 81 mg by mouth daily.   Yes Historical Provider, MD  calcium carbonate (TUMS - DOSED IN MG ELEMENTAL CALCIUM) 500 MG chewable tablet Chew 2 tablets by mouth every 6 (six) hours.    Yes Historical Provider, MD  cetirizine (ZYRTEC) 10 MG tablet Take 10 mg by mouth daily.   Yes Historical Provider, MD  donepezil (ARICEPT) 5 MG tablet Take 5 mg by mouth at bedtime.   Yes Historical Provider, MD  finasteride (PROSCAR) 5 MG tablet Take 5 mg by mouth daily.   Yes Historical Provider, MD  folic acid (FOLVITE) 1 MG tablet Take 1 mg by mouth daily.   Yes Historical Provider, MD  HYDROcodone-acetaminophen (NORCO/VICODIN) 5-325 MG tablet Take 1 tablet by mouth 2 (two) times daily.   Yes Historical Provider, MD  lactulose (CHRONULAC) 10 GM/15ML solution Take 30 g by mouth 2 (two) times daily. Also has a order for 81mls once a day as needed for constipation   Yes Historical Provider, MD  Lactulose 20 GM/30ML SOLN Take 30 mLs by mouth 2 (two) times daily.    Yes Historical Provider, MD  levothyroxine (SYNTHROID, LEVOTHROID) 25 MCG tablet Take 25 mcg by mouth daily before breakfast.   Yes Historical Provider, MD  Menthol-Methyl Salicylate (BENGAY GREASELESS EX) Apply 1 application topically 3 (three) times daily.   Yes  Historical Provider, MD  mirtazapine (REMERON) 30 MG tablet Take 30 mg by mouth at bedtime.   Yes Historical Provider, MD  polyethylene glycol powder (MIRALAX) powder Take 17 g by mouth daily. 10/05/15  Yes [name], MD  sodium chloride 1 G tablet Take 1 g by mouth 2 (two) times daily. 8am and 2pm   Yes Historical Provider, MD  tamsulosin (FLOMAX) 0.4 MG CAPS capsule Take 0.4 mg by mouth daily.   Yes Historical Provider, MD  temazepam (RESTORIL) 30 MG capsule Take 30 mg by mouth at bedtime.    Yes Historical Provider, MD  thiamine (VITAMIN B-1) 100 MG tablet Take 100 mg by  mouth daily.   Yes Historical Provider, MD  tiotropium (SPIRIVA) 18 MCG inhalation capsule Place 18 mcg into inhaler and inhale daily.   Yes Historical Provider, MD   BP 118/84 mmHg  Pulse 60  Temp(Src) 98.1 F (36.7 C) (Oral)  Resp 20  SpO2 96% Physical Exam  Constitutional: He is oriented to person, place, and time. He appears well-developed and well-nourished. No distress.  HENT:  Head: Normocephalic.  Eyes: Conjunctivae and EOM are normal.  Cardiovascular: Normal rate.   Pulmonary/Chest: Effort normal and breath sounds normal. No stridor.  Abdominal: Soft. Bowel sounds are normal. He exhibits no distension and no mass. There is no tenderness. There is no rebound and no guarding.  Musculoskeletal: Normal range of motion.  Neurological: He is alert and oriented to person, place, and time.  Psychiatric: He has a normal mood and affect.  Nursing note and vitals reviewed.   ED Course  Procedures (including critical care time) Labs Review Labs Reviewed  CBC WITH DIFFERENTIAL/PLATELET - Abnormal; Notable for the following:    HCT 38.3 (*)    All other components within normal limits  COMPREHENSIVE METABOLIC PANEL - Abnormal; Notable for the following:    Sodium 131 (*)    CO2 20 (*)    BUN 22 (*)    Creatinine, Ser 1.74 (*)    GFR calc non Af Amer 39 (*)    GFR calc Af Amer 45 (*)    All other components within normal limits  LIPASE, BLOOD - Abnormal; Notable for the following:    Lipase 82 (*)    All other components within normal limits  URINALYSIS, ROUTINE W REFLEX MICROSCOPIC (NOT AT [facility])    Imaging Review No results found. I have personally reviewed and evaluated these images and lab results as part of my medical decision-making.   EKG Interpretation None      MDM   Final diagnoses:  None    Filed Vitals:   12/21/15 1218 12/21/15 1330 12/21/15 1400 12/21/15 1430  BP: 152/103 125/90 123/98 118/84  Pulse: 75 62 63 60  Temp: 98.1 F (36.7 C)      TempSrc: Oral     Resp: 20     SpO2: 99% 97% 96% 96%    Medications  morphine 4 MG/ML injection 4 mg (4 mg Intravenous Given 12/21/15 1320)    Luke Wiggins is 64 y.o. male presenting with abdominal pain, no other symptoms. Abdominal exam is nonsurgical. Blood work reassuring, repeat abdominal exam is benign.   This is a shared visit with the attending physician who personally evaluated the patient and agrees with the care plan.   Waiting on UA, instructed nurse in and T2 calf patient, will measure post void residual. Case signed out to PA Harris at shift change.     Monico Blitz,  PA-C 12/21/15 1518  Tech states that she obtained greater than 400 mL in and out catheter. Has history of BPH, patient acute urinary retention, will need Foley to go home with. PA Harris aware.   Monico Blitz, PA-C 12/21/15 1543  Noemi Chapel, MD 12/21/15 2114

## 2015-12-29 ENCOUNTER — Emergency Department (HOSPITAL_COMMUNITY)
Admission: EM | Admit: 2015-12-29 | Discharge: 2015-12-29 | Disposition: A | Discharge status: Home / Self Care | Payer: Medicare Other | Attending: Emergency Medicine | Admitting: Emergency Medicine

## 2015-12-29 ENCOUNTER — Emergency Department (HOSPITAL_COMMUNITY): Payer: Medicare Other

## 2015-12-29 ENCOUNTER — Encounter (HOSPITAL_COMMUNITY): Payer: Self-pay

## 2015-12-29 DIAGNOSIS — N400 Enlarged prostate without lower urinary tract symptoms: Secondary | ICD-10-CM | POA: Insufficient documentation

## 2015-12-29 DIAGNOSIS — Z8619 Personal history of other infectious and parasitic diseases: Secondary | ICD-10-CM | POA: Diagnosis not present

## 2015-12-29 DIAGNOSIS — M545 Low back pain, unspecified: Secondary | ICD-10-CM

## 2015-12-29 DIAGNOSIS — E039 Hypothyroidism, unspecified: Secondary | ICD-10-CM | POA: Diagnosis not present

## 2015-12-29 DIAGNOSIS — G8929 Other chronic pain: Secondary | ICD-10-CM | POA: Insufficient documentation

## 2015-12-29 DIAGNOSIS — I10 Essential (primary) hypertension: Secondary | ICD-10-CM | POA: Insufficient documentation

## 2015-12-29 DIAGNOSIS — K5900 Constipation, unspecified: Secondary | ICD-10-CM | POA: Insufficient documentation

## 2015-12-29 DIAGNOSIS — J449 Chronic obstructive pulmonary disease, unspecified: Secondary | ICD-10-CM | POA: Diagnosis not present

## 2015-12-29 DIAGNOSIS — Z7982 Long term (current) use of aspirin: Secondary | ICD-10-CM | POA: Diagnosis not present

## 2015-12-29 DIAGNOSIS — Z859 Personal history of malignant neoplasm, unspecified: Secondary | ICD-10-CM | POA: Diagnosis not present

## 2015-12-29 DIAGNOSIS — Z79899 Other long term (current) drug therapy: Secondary | ICD-10-CM | POA: Diagnosis not present

## 2015-12-29 DIAGNOSIS — M549 Dorsalgia, unspecified: Secondary | ICD-10-CM | POA: Diagnosis present

## 2015-12-29 DIAGNOSIS — F17210 Nicotine dependence, cigarettes, uncomplicated: Secondary | ICD-10-CM | POA: Diagnosis not present

## 2015-12-29 DIAGNOSIS — Z8639 Personal history of other endocrine, nutritional and metabolic disease: Secondary | ICD-10-CM | POA: Diagnosis not present

## 2015-12-29 DIAGNOSIS — G4700 Insomnia, unspecified: Secondary | ICD-10-CM | POA: Diagnosis not present

## 2015-12-29 DIAGNOSIS — F329 Major depressive disorder, single episode, unspecified: Secondary | ICD-10-CM | POA: Diagnosis not present

## 2015-12-29 MED ORDER — GUAIFENESIN ER 600 MG PO TB12
600.0000 mg | ORAL_TABLET | Freq: Two times a day (BID) | ORAL | Status: DC
  Administered 2015-12-29: 600 mg via ORAL
  Filled 2015-12-29 (×2): qty 1

## 2015-12-29 MED ORDER — OXYCODONE-ACETAMINOPHEN 5-325 MG PO TABS
2.0000 | ORAL_TABLET | Freq: Once | ORAL | Status: AC
  Administered 2015-12-29: 2 via ORAL
  Filled 2015-12-29: qty 2

## 2015-12-29 NOTE — ED Provider Notes (Signed)
CSN: [id number]     Arrival date & time 12/29/15  [id number] History   First MD Initiated Contact with Patient 12/29/15 [phone]     Chief Complaint  Patient presents with  . Back Pain     (Consider location/radiation/quality/duration/timing/severity/associated sxs/prior Treatment) HPI   Luke Wiggins is a 64 y.o. male, with a history of chronic back pain, COPD, hepatitis C, a distant history of cancer, presenting to the ED with acute on chronic back pain with increasing pain last night. Pt states, "It hurts worse when it rains so that's why I think it's hurting." Pt describes the pain as achy, rates 10/10 (usually a 9/10 on a daily basis at baseline), non-radiating. Pt states he has chronic back pain from shrapnel injury in [place]. 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 Pt arrived from [facility] via EMS.     Past Medical History  Diagnosis Date  . Hyponatremia   . Hypertension   . Hyperlipidemia   . BPH (benign prostatic hypertrophy)   . Hepatitis C   . COPD (chronic obstructive pulmonary disease) ([place])   . Chronic constipation   . Cancer ([place])   . Depression   . Insomnia   . Hypothyroidism    Past Surgical History  Procedure Laterality Date  . Adrenalectomy Left   . Nephrectomy Left    Family History  Problem Relation Age of Onset  . Family history unknown: Yes   Social History  Substance Use Topics  . Smoking status: Current Some Day Smoker    Types: Cigarettes  . Smokeless tobacco: None  . Alcohol Use: No    Review of Systems  Constitutional: Negative for fever, chills and diaphoresis.  Respiratory: Negative for shortness of breath.   Cardiovascular: Negative for chest pain.  Gastrointestinal: Negative for nausea, vomiting, abdominal pain, diarrhea and constipation.  Musculoskeletal: Positive for back pain. Negative for neck pain and neck stiffness.  Neurological: Negative for dizziness, syncope, weakness, light-headedness,  numbness and headaches.  All other systems reviewed and are negative.     Allergies  Review of patient's allergies indicates no known allergies.  Home Medications   Prior to Admission medications   Medication Sig Start Date End Date Taking? Authorizing Provider  albuterol (PROVENTIL HFA;VENTOLIN HFA) 108 (90 BASE) MCG/ACT inhaler Inhale 2 puffs into the lungs every 6 (six) hours as needed for wheezing or shortness of breath.    Historical Provider, MD  amLODipine (NORVASC) 5 MG tablet Take 5 mg by mouth daily.    Historical Provider, MD  aspirin EC 81 MG tablet Take 81 mg by mouth daily.    Historical Provider, MD  calcium carbonate (TUMS - DOSED IN MG ELEMENTAL CALCIUM) 500 MG chewable tablet Chew 2 tablets by mouth every 6 (six) hours.     Historical Provider, MD  cetirizine (ZYRTEC) 10 MG tablet Take 10 mg by mouth daily.    Historical Provider, MD  donepezil (ARICEPT) 5 MG tablet Take 5 mg by mouth at bedtime.    Historical Provider, MD  finasteride (PROSCAR) 5 MG tablet Take 5 mg by mouth daily.    Historical Provider, MD  folic acid (FOLVITE) 1 MG tablet Take 1 mg by mouth daily.    Historical Provider, MD  HYDROcodone-acetaminophen (NORCO/VICODIN) 5-325 MG tablet Take 1 tablet by mouth 2 (two) times daily.    Historical Provider, MD  lactulose (CHRONULAC) 10 GM/15ML solution Take 30 g by mouth 2 (two) times daily. Also  has a order for 21mls once a day as needed for constipation    Historical Provider, MD  Lactulose 20 GM/30ML SOLN Take 30 mLs by mouth 2 (two) times daily.     Historical Provider, MD  levothyroxine (SYNTHROID, LEVOTHROID) 25 MCG tablet Take 25 mcg by mouth daily before breakfast.    Historical Provider, MD  Menthol-Methyl Salicylate (BENGAY GREASELESS EX) Apply 1 application topically 3 (three) times daily.    Historical Provider, MD  mirtazapine (REMERON) 30 MG tablet Take 30 mg by mouth at bedtime.    Historical Provider, MD  polyethylene glycol powder (MIRALAX)  powder Take 17 g by mouth daily. 10/05/15   [name], MD  sodium chloride 1 G tablet Take 1 g by mouth 2 (two) times daily. 8am and 2pm    Historical Provider, MD  tamsulosin (FLOMAX) 0.4 MG CAPS capsule Take 0.4 mg by mouth daily.    Historical Provider, MD  temazepam (RESTORIL) 30 MG capsule Take 30 mg by mouth at bedtime.     Historical Provider, MD  thiamine (VITAMIN B-1) 100 MG tablet Take 100 mg by mouth daily.    Historical Provider, MD  tiotropium (SPIRIVA) 18 MCG inhalation capsule Place 18 mcg into inhaler and inhale daily.    Historical Provider, MD   BP 151/77 mmHg  Pulse 79  Temp(Src) 98.7 F (37.1 C) (Oral)  Resp 20  Ht 5\' 6"  (1.676 m)  Wt 63.504 kg  BMI 22.61 kg/m2  SpO2 98% Physical Exam  Constitutional: He is oriented to person, place, and time. He appears well-developed and well-nourished. No distress.  HENT:  Head: Normocephalic and atraumatic.  Eyes: Conjunctivae and EOM are normal. Pupils are equal, round, and reactive to light.  Neck: Normal range of motion. Neck supple.  Cardiovascular: Normal rate, regular rhythm, normal heart sounds and intact distal pulses.   Pulmonary/Chest: Effort normal and breath sounds normal. No respiratory distress.  Abdominal: Soft. Bowel sounds are normal.  Musculoskeletal: He exhibits no edema or tenderness.  Full ROM in all extremities and spine. No paraspinal tenderness.   Lymphadenopathy:    He has no cervical adenopathy.  Neurological: He is alert and oriented to person, place, and time. He has normal reflexes.  No sensory deficits. Strength 5/5 in all extremities. Pt walks with a walker. Gait testing deferred. Coordination intact. Cranial nerves III-XII grossly intact. No facial droop.   Skin: Skin is warm and dry. He is not diaphoretic.  Nursing note and vitals reviewed.   ED Course  Procedures (including critical care time) Labs Review Labs Reviewed - No data to display  Imaging Review Dg Lumbar Spine  Complete  12/29/2015  CLINICAL DATA:  Low back pain, no known injury, initial encounter EXAM: LUMBAR SPINE - COMPLETE 4+ VIEW COMPARISON:  None. FINDINGS: Vertebral body height is well maintained. Very mild osteophytic changes are noted. Mild facet hypertrophic changes are seen. Disc space narrowing is noted at L5-S1. No compression deformities are noted. No soft tissue abnormality is seen. IMPRESSION: Mild degenerative change without acute abnormality. Electronically Signed   By: Inez Catalina M.D.   On: 12/29/2015 08:45   I have personally reviewed and evaluated these images and lab results as part of my medical decision-making.   EKG Interpretation None      MDM   Final diagnoses:  Midline low back pain without sciatica    Ackley Brixius presents with acute on chronic back pain since last night.  Findings and plan of care discussed  with Nat Christen, MD.  Patient's presentation is consistent with acute exacerbation of chronic spine arthritis. Patient is afebrile, not tachycardic, is normotensive, nontoxic appearing, has no neuro deficits, and appears to be in no acute distress. 8:13 AM Spoke with Phineas Real, supervisor at [facility], who states that the patient was complaining of back pain last night, told the staff that he did not to wait for his morning scheduled hydrocodone, and requested to be transported to the hospital. 8:48 AM Pt reassessed. States his back feels better. Requested medication for nasal congestion. X-ray shows no acute abnormality.  Filed Vitals:   12/29/15 0700 12/29/15 0730 12/29/15 0800 12/29/15 0802  BP: 150/97 152/94 151/77 151/77  Pulse: 77 73 75 79  Temp:      TempSrc:      Resp:    20  Height:      Weight:      SpO2: 99% 98% 98% 98%     Lorayne Bender, PA-C 12/29/15 0905  Nat Christen, MD 12/29/15 [phone]

## 2015-12-29 NOTE — Discharge Instructions (Signed)
You have been seen today for acute on chronic back pain. Your x-ray shows no acute abnormalities. Follow up with PCP as needed. Return to ED should symptoms worsen.

## 2015-12-29 NOTE — ED Notes (Addendum)
Per [facility], pt from [facility] for chronic back pain d/t shrapnel from serving in [place]. Per EMS, pt became very emotional when asked what made it worse tonight. Has an indwelling foley in place. Uses a walker but is a high fall risk.

## 2015-12-29 NOTE — ED Notes (Signed)
Called [facility] for PT Transport

## 2015-12-29 NOTE — ED Notes (Signed)
Patient transported to X-ray 

## 2016-01-12 ENCOUNTER — Encounter (HOSPITAL_COMMUNITY): Payer: Self-pay

## 2016-01-12 ENCOUNTER — Emergency Department (HOSPITAL_COMMUNITY)
Admission: EM | Admit: 2016-01-12 | Discharge: 2016-01-12 | Disposition: A | Discharge status: Home / Self Care | Payer: Medicare Other | Attending: Emergency Medicine | Admitting: Emergency Medicine

## 2016-01-12 DIAGNOSIS — M545 Low back pain: Secondary | ICD-10-CM | POA: Insufficient documentation

## 2016-01-12 DIAGNOSIS — E785 Hyperlipidemia, unspecified: Secondary | ICD-10-CM | POA: Diagnosis not present

## 2016-01-12 DIAGNOSIS — Z7982 Long term (current) use of aspirin: Secondary | ICD-10-CM | POA: Diagnosis not present

## 2016-01-12 DIAGNOSIS — Z79899 Other long term (current) drug therapy: Secondary | ICD-10-CM | POA: Insufficient documentation

## 2016-01-12 DIAGNOSIS — I10 Essential (primary) hypertension: Secondary | ICD-10-CM | POA: Insufficient documentation

## 2016-01-12 DIAGNOSIS — N400 Enlarged prostate without lower urinary tract symptoms: Secondary | ICD-10-CM | POA: Diagnosis not present

## 2016-01-12 DIAGNOSIS — G4700 Insomnia, unspecified: Secondary | ICD-10-CM | POA: Insufficient documentation

## 2016-01-12 DIAGNOSIS — Z859 Personal history of malignant neoplasm, unspecified: Secondary | ICD-10-CM | POA: Diagnosis not present

## 2016-01-12 DIAGNOSIS — F329 Major depressive disorder, single episode, unspecified: Secondary | ICD-10-CM | POA: Diagnosis not present

## 2016-01-12 DIAGNOSIS — F17210 Nicotine dependence, cigarettes, uncomplicated: Secondary | ICD-10-CM | POA: Insufficient documentation

## 2016-01-12 DIAGNOSIS — J449 Chronic obstructive pulmonary disease, unspecified: Secondary | ICD-10-CM | POA: Insufficient documentation

## 2016-01-12 DIAGNOSIS — G8929 Other chronic pain: Secondary | ICD-10-CM | POA: Insufficient documentation

## 2016-01-12 DIAGNOSIS — E039 Hypothyroidism, unspecified: Secondary | ICD-10-CM | POA: Insufficient documentation

## 2016-01-12 DIAGNOSIS — Z8619 Personal history of other infectious and parasitic diseases: Secondary | ICD-10-CM | POA: Diagnosis not present

## 2016-01-12 DIAGNOSIS — K5900 Constipation, unspecified: Secondary | ICD-10-CM | POA: Diagnosis not present

## 2016-01-12 MED ORDER — HYDROMORPHONE HCL 1 MG/ML IJ SOLN
1.0000 mg | Freq: Once | INTRAMUSCULAR | Status: AC
  Administered 2016-01-12: 1 mg via INTRAMUSCULAR
  Filled 2016-01-12: qty 1

## 2016-01-12 MED ORDER — KETOROLAC TROMETHAMINE 15 MG/ML IJ SOLN
15.0000 mg | Freq: Once | INTRAMUSCULAR | Status: AC
  Administered 2016-01-12: 15 mg via INTRAMUSCULAR
  Filled 2016-01-12: qty 1

## 2016-01-12 MED ORDER — DIAZEPAM 2 MG PO TABS
2.0000 mg | ORAL_TABLET | Freq: Once | ORAL | Status: AC
  Administered 2016-01-12: 2 mg via ORAL
  Filled 2016-01-12: qty 1

## 2016-01-12 MED ORDER — MELOXICAM 7.5 MG PO TABS: 7.5000 mg | ORAL_TABLET | Freq: Every day | ORAL | Status: DC

## 2016-01-12 NOTE — ED Notes (Signed)
Per EMS- Patient is a resident of [place]. Patient was tearful and anxious when EMS arrived. Patient c/o constipation and back pain. Patient reported that his pain medicine does not work. Patient takes Norco 5/325 mg bid.

## 2016-01-12 NOTE — Discharge Instructions (Signed)

## 2016-01-12 NOTE — ED Notes (Signed)
Bed: [facility] Expected date:  Expected time:  Means of arrival:  Comments: EMS- chronic back pain/MR

## 2016-01-12 NOTE — ED Notes (Signed)
Report given to [place] at [facility].

## 2016-01-15 ENCOUNTER — Emergency Department (HOSPITAL_COMMUNITY): Payer: Medicare Other

## 2016-01-15 ENCOUNTER — Encounter (HOSPITAL_COMMUNITY): Payer: Self-pay

## 2016-01-15 ENCOUNTER — Emergency Department (HOSPITAL_COMMUNITY)
Admission: EM | Admit: 2016-01-15 | Discharge: 2016-01-15 | Disposition: A | Discharge status: Home / Self Care | Payer: Medicare Other | Attending: Emergency Medicine | Admitting: Emergency Medicine

## 2016-01-15 DIAGNOSIS — E871 Hypo-osmolality and hyponatremia: Secondary | ICD-10-CM | POA: Insufficient documentation

## 2016-01-15 DIAGNOSIS — R112 Nausea with vomiting, unspecified: Secondary | ICD-10-CM | POA: Insufficient documentation

## 2016-01-15 DIAGNOSIS — F17210 Nicotine dependence, cigarettes, uncomplicated: Secondary | ICD-10-CM | POA: Insufficient documentation

## 2016-01-15 DIAGNOSIS — R630 Anorexia: Secondary | ICD-10-CM | POA: Insufficient documentation

## 2016-01-15 DIAGNOSIS — N189 Chronic kidney disease, unspecified: Secondary | ICD-10-CM | POA: Diagnosis not present

## 2016-01-15 DIAGNOSIS — G8929 Other chronic pain: Secondary | ICD-10-CM | POA: Diagnosis not present

## 2016-01-15 DIAGNOSIS — R143 Flatulence: Secondary | ICD-10-CM | POA: Insufficient documentation

## 2016-01-15 DIAGNOSIS — Z8553 Personal history of malignant neoplasm of renal pelvis: Secondary | ICD-10-CM | POA: Insufficient documentation

## 2016-01-15 DIAGNOSIS — Z8619 Personal history of other infectious and parasitic diseases: Secondary | ICD-10-CM | POA: Insufficient documentation

## 2016-01-15 DIAGNOSIS — M545 Low back pain: Secondary | ICD-10-CM | POA: Diagnosis not present

## 2016-01-15 DIAGNOSIS — Z7982 Long term (current) use of aspirin: Secondary | ICD-10-CM | POA: Insufficient documentation

## 2016-01-15 DIAGNOSIS — E039 Hypothyroidism, unspecified: Secondary | ICD-10-CM | POA: Diagnosis not present

## 2016-01-15 DIAGNOSIS — Z791 Long term (current) use of non-steroidal anti-inflammatories (NSAID): Secondary | ICD-10-CM | POA: Insufficient documentation

## 2016-01-15 DIAGNOSIS — R1031 Right lower quadrant pain: Secondary | ICD-10-CM | POA: Insufficient documentation

## 2016-01-15 DIAGNOSIS — J441 Chronic obstructive pulmonary disease with (acute) exacerbation: Secondary | ICD-10-CM | POA: Diagnosis not present

## 2016-01-15 DIAGNOSIS — G4700 Insomnia, unspecified: Secondary | ICD-10-CM | POA: Diagnosis not present

## 2016-01-15 DIAGNOSIS — K5900 Constipation, unspecified: Secondary | ICD-10-CM | POA: Diagnosis not present

## 2016-01-15 DIAGNOSIS — R1032 Left lower quadrant pain: Secondary | ICD-10-CM | POA: Insufficient documentation

## 2016-01-15 DIAGNOSIS — I129 Hypertensive chronic kidney disease with stage 1 through stage 4 chronic kidney disease, or unspecified chronic kidney disease: Secondary | ICD-10-CM | POA: Insufficient documentation

## 2016-01-15 DIAGNOSIS — Z8659 Personal history of other mental and behavioral disorders: Secondary | ICD-10-CM | POA: Insufficient documentation

## 2016-01-15 DIAGNOSIS — Z79899 Other long term (current) drug therapy: Secondary | ICD-10-CM | POA: Insufficient documentation

## 2016-01-15 DIAGNOSIS — N400 Enlarged prostate without lower urinary tract symptoms: Secondary | ICD-10-CM | POA: Insufficient documentation

## 2016-01-15 DIAGNOSIS — M549 Dorsalgia, unspecified: Secondary | ICD-10-CM

## 2016-01-15 HISTORY — DX: Other chronic pain: G89.29

## 2016-01-15 LAB — CBC WITH DIFFERENTIAL/PLATELET
BASOS ABS: 0 10*3/uL (ref 0.0–0.1)
BASOS PCT: 0 %
EOS ABS: 0.3 10*3/uL (ref 0.0–0.7)
EOS PCT: 3 %
HCT: 41.4 % (ref 39.0–52.0)
Hemoglobin: 14.5 g/dL (ref 13.0–17.0)
Lymphocytes Relative: 25 %
Lymphs Abs: 2 10*3/uL (ref 0.7–4.0)
MCH: 30.1 pg (ref 26.0–34.0)
MCHC: 35 g/dL (ref 30.0–36.0)
MCV: 86.1 fL (ref 78.0–100.0)
Monocytes Absolute: 0.7 10*3/uL (ref 0.1–1.0)
Monocytes Relative: 9 %
Neutro Abs: 4.9 10*3/uL (ref 1.7–7.7)
Neutrophils Relative %: 63 %
PLATELETS: 231 10*3/uL (ref 150–400)
RBC: 4.81 MIL/uL (ref 4.22–5.81)
RDW: 13.1 % (ref 11.5–15.5)
WBC: 7.9 10*3/uL (ref 4.0–10.5)

## 2016-01-15 LAB — COMPREHENSIVE METABOLIC PANEL
ALT: 17 U/L (ref 17–63)
AST: 24 U/L (ref 15–41)
Albumin: 3.8 g/dL (ref 3.5–5.0)
Alkaline Phosphatase: 75 U/L (ref 38–126)
Anion gap: 12 (ref 5–15)
BUN: 19 mg/dL (ref 6–20)
CHLORIDE: 94 mmol/L — AB (ref 101–111)
CO2: 22 mmol/L (ref 22–32)
CREATININE: 1.8 mg/dL — AB (ref 0.61–1.24)
Calcium: 9.8 mg/dL (ref 8.9–10.3)
GFR calc non Af Amer: 38 mL/min — ABNORMAL LOW (ref >60)
GFR, EST AFRICAN AMERICAN: 44 mL/min — AB (ref >60)
Glucose, Bld: 96 mg/dL (ref 65–99)
Potassium: 4.8 mmol/L (ref 3.5–5.1)
SODIUM: 128 mmol/L — AB (ref 135–145)
Total Bilirubin: 0.5 mg/dL (ref 0.3–1.2)
Total Protein: 8 g/dL (ref 6.5–8.1)

## 2016-01-15 LAB — URINALYSIS, ROUTINE W REFLEX MICROSCOPIC
BILIRUBIN URINE: NEGATIVE
Glucose, UA: NEGATIVE mg/dL
HGB URINE DIPSTICK: NEGATIVE
Ketones, ur: NEGATIVE mg/dL
Leukocytes, UA: NEGATIVE
Nitrite: NEGATIVE
PROTEIN: NEGATIVE mg/dL
Specific Gravity, Urine: 1.012 (ref 1.005–1.030)
pH: 7 (ref 5.0–8.0)

## 2016-01-15 LAB — LIPASE, BLOOD: Lipase: 62 U/L — ABNORMAL HIGH (ref 11–51)

## 2016-01-15 LAB — I-STAT CG4 LACTIC ACID, ED
LACTIC ACID, VENOUS: 0.94 mmol/L (ref 0.5–2.0)
LACTIC ACID, VENOUS: 0.67 mmol/L (ref 0.5–2.0)

## 2016-01-15 MED ORDER — LIDOCAINE HCL 2 % EX GEL: 1.0000 "application " | Freq: Once | CUTANEOUS | Status: DC

## 2016-01-15 MED ORDER — IOHEXOL 300 MG/ML  SOLN
25.0000 mL | Freq: Once | INTRAMUSCULAR | Status: DC | PRN
  Administered 2016-01-15: 25 mL via ORAL
  Filled 2016-01-15: qty 30

## 2016-01-15 MED ORDER — MORPHINE SULFATE (PF) 4 MG/ML IV SOLN
4.0000 mg | Freq: Once | INTRAVENOUS | Status: AC
  Administered 2016-01-15: 4 mg via INTRAVENOUS
  Filled 2016-01-15: qty 1

## 2016-01-15 MED ORDER — SODIUM CHLORIDE 0.9 % IV BOLUS (SEPSIS)
1000.0000 mL | Freq: Once | INTRAVENOUS | Status: AC
  Administered 2016-01-15: 1000 mL via INTRAVENOUS

## 2016-01-15 MED ORDER — IOHEXOL 300 MG/ML  SOLN
75.0000 mL | Freq: Once | INTRAMUSCULAR | Status: AC | PRN
  Administered 2016-01-15: 75 mL via INTRAVENOUS

## 2016-01-15 MED ORDER — ONDANSETRON HCL 4 MG/2ML IJ SOLN
4.0000 mg | Freq: Once | INTRAMUSCULAR | Status: AC
  Administered 2016-01-15: 4 mg via INTRAVENOUS
  Filled 2016-01-15: qty 2

## 2016-01-15 NOTE — ED Provider Notes (Addendum)
CSN: [id number]     Arrival date & time 01/15/16  1616 History   First MD Initiated Contact with Patient 01/15/16 1627     Chief Complaint  Patient presents with  . Abdominal Pain     (Consider location/radiation/quality/duration/timing/severity/associated sxs/prior Treatment) Patient is a 65 y.o. male presenting with abdominal pain.  Abdominal Pain Pain location:  LLQ, RLQ and suprapubic Pain quality: aching   Pain radiates to:  Does not radiate Pain severity:  Severe Onset quality:  Gradual Duration:  4 hours Timing:  Constant Progression:  Unchanged Chronicity:  New Context: not alcohol use and not medication withdrawal   Relieved by:  Nothing Worsened by:  Nothing tried Ineffective treatments: hydrocodone, mobic. Associated symptoms: anorexia (decreased appetite), flatus, nausea, shortness of breath and vomiting (last night)   Associated symptoms: no chest pain, no constipation, no cough, no diarrhea, no dysuria (catheter in, last changed 2 weeks ago), no fever and no sore throat   Risk factors: multiple surgeries     Past Medical History  Diagnosis Date  . Hyponatremia   . Hypertension   . Hyperlipidemia   . BPH (benign prostatic hypertrophy)   . Hepatitis C   . COPD (chronic obstructive pulmonary disease) ([place])   . Chronic constipation   . Cancer ([place])   . Depression   . Insomnia   . Hypothyroidism   . Chronic pain    Past Surgical History  Procedure Laterality Date  . Adrenalectomy Left   . Nephrectomy Left    Family History  Problem Relation Age of Onset  . Family history unknown: Yes   Social History  Substance Use Topics  . Smoking status: Current Some Day Smoker    Types: Cigarettes  . Smokeless tobacco: None  . Alcohol Use: No    Review of Systems  Constitutional: Negative for fever.  HENT: Negative for sore throat.   Eyes: Negative for visual disturbance.  Respiratory: Positive for shortness of breath. Negative for cough.    Cardiovascular: Negative for chest pain.  Gastrointestinal: Positive for nausea, vomiting (last night), abdominal pain, anorexia (decreased appetite) and flatus. Negative for diarrhea and constipation.  Genitourinary: Negative for dysuria (catheter in, last changed 2 weeks ago) and difficulty urinating.  Musculoskeletal: Negative for back pain and neck stiffness.  Skin: Negative for rash.  Neurological: Negative for syncope and headaches.      Allergies  Review of patient's allergies indicates no known allergies.  Home Medications   Prior to Admission medications   Medication Sig Start Date End Date Taking? Authorizing Provider  albuterol (PROAIR HFA) 108 (90 Base) MCG/ACT inhaler Inhale 2 puffs into the lungs every 6 (six) hours as needed for wheezing or shortness of breath.   Yes Historical Provider, MD  amLODipine (NORVASC) 5 MG tablet Take 5 mg by mouth daily.   Yes Historical Provider, MD  aspirin 81 MG chewable tablet Chew 81 mg by mouth daily.   Yes Historical Provider, MD  calcium carbonate (TUMS - DOSED IN MG ELEMENTAL CALCIUM) 500 MG chewable tablet Chew 2 tablets by mouth every 6 (six) hours. For heartburn   Yes Historical Provider, MD  cetirizine (ZYRTEC) 10 MG tablet Take 10 mg by mouth daily.   Yes Historical Provider, MD  dextromethorphan-guaiFENesin (MUCINEX DM) 30-600 MG 12hr tablet Take 2 tablets by mouth every 12 (twelve) hours as needed for cough.   Yes Historical Provider, MD  docusate sodium (COLACE) 100 MG capsule Take 200 mg by mouth daily.   Yes  Historical Provider, MD  donepezil (ARICEPT) 5 MG tablet Take 5 mg by mouth at bedtime.   Yes Historical Provider, MD  finasteride (PROSCAR) 5 MG tablet Take 5 mg by mouth daily.   Yes Historical Provider, MD  folic acid (FOLVITE) 1 MG tablet Take 1 mg by mouth daily.   Yes Historical Provider, MD  HYDROcodone-acetaminophen (NORCO/VICODIN) 5-325 MG tablet Take 1 tablet by mouth 2 (two) times daily. 8am, 8pm   Yes  Historical Provider, MD  lactulose (CHRONULAC) 10 GM/15ML solution Take 20 g by mouth See admin instructions. Take 30 mls (20 g) by mouth twice daily at 8am and 8pm, may take one additional dose as needed for constipation   Yes Historical Provider, MD  levothyroxine (SYNTHROID, LEVOTHROID) 25 MCG tablet Take 25 mcg by mouth daily.    Yes Historical Provider, MD  meloxicam (MOBIC) 7.5 MG tablet Take 1 tablet (7.5 mg total) by mouth daily. 01/12/16  Yes Virgel Manifold, MD  Menthol, Topical Analgesic, (BENGAY EX) Apply 1 application topically 3 (three) times daily.   Yes Historical Provider, MD  mirtazapine (REMERON) 30 MG tablet Take 30 mg by mouth at bedtime.   Yes Historical Provider, MD  polyethylene glycol powder (MIRALAX) powder Take 17 g by mouth daily. Patient taking differently: Take 17 g by mouth daily. Mix in 8 oz of liquid and drink 10/05/15  Yes [name], MD  sodium chloride 1 G tablet Take 1 g by mouth 2 (two) times daily. 8am and 2pm   Yes Historical Provider, MD  tamsulosin (FLOMAX) 0.4 MG CAPS capsule Take 0.4 mg by mouth daily.   Yes Historical Provider, MD  temazepam (RESTORIL) 30 MG capsule Take 30 mg by mouth at bedtime.    Yes Historical Provider, MD  thiamine (VITAMIN B-1) 100 MG tablet Take 100 mg by mouth daily.   Yes Historical Provider, MD  tiotropium (SPIRIVA) 18 MCG inhalation capsule Place 18 mcg into inhaler and inhale daily.   Yes Historical Provider, MD   BP 128/98 mmHg  Pulse 70  Temp(Src) 97.9 F (36.6 C) (Oral)  Resp 16  Ht 5\' 7"  (1.702 m)  Wt 135 lb (61.236 kg)  BMI 21.14 kg/m2  SpO2 97% Physical Exam  Constitutional: He is oriented to person, place, and time. He appears well-developed and well-nourished. No distress.  HENT:  Head: Normocephalic and atraumatic.  Eyes: Conjunctivae and EOM are normal.  Neck: Normal range of motion.  Cardiovascular: Normal rate, regular rhythm, normal heart sounds and intact distal pulses.  Exam reveals no gallop and no  friction rub.   No murmur heard. Pulmonary/Chest: Effort normal and breath sounds normal. No respiratory distress. He has no wheezes. He has no rales.  Abdominal: Soft. He exhibits no distension. There is tenderness. There is guarding (RLQ). There is no CVA tenderness.  Musculoskeletal: He exhibits no edema.       Lumbar back: He exhibits tenderness and bony tenderness.  Neurological: He is alert and oriented to person, place, and time.  Skin: Skin is warm and dry. He is not diaphoretic.  Nursing note and vitals reviewed.   ED Course  Procedures (including critical care time) Labs Review Labs Reviewed  COMPREHENSIVE METABOLIC PANEL - Abnormal; Notable for the following:    Sodium 128 (*)    Chloride 94 (*)    Creatinine, Ser 1.80 (*)    GFR calc non Af Amer 38 (*)    GFR calc Af Amer 44 (*)    All other  components within normal limits  LIPASE, BLOOD - Abnormal; Notable for the following:    Lipase 62 (*)    All other components within normal limits  URINALYSIS, ROUTINE W REFLEX MICROSCOPIC (NOT AT [facility])  CBC WITH DIFFERENTIAL/PLATELET  I-STAT CG4 LACTIC ACID, ED  I-STAT CG4 LACTIC ACID, ED    Imaging Review Ct Abdomen Pelvis W Contrast  01/15/2016  CLINICAL DATA:  Chronic low back pain with right lower quadrant abdominal pain. History of hepatitis-C, nephrectomy and chronic renal disease. EXAM: CT ABDOMEN AND PELVIS WITH CONTRAST TECHNIQUE: Multidetector CT imaging of the abdomen and pelvis was performed using the standard protocol following bolus administration of intravenous contrast. CONTRAST:  35mL OMNIPAQUE IOHEXOL 300 MG/ML  SOLN COMPARISON:  Abdominal pelvic CT 09/07/2015. FINDINGS: Lower chest: Clear lung bases. No significant pleural or pericardial effusion. Emphysematous changes are present at both lung bases. There is a small hiatal hernia. Hepatobiliary: The liver is normal in density without focal abnormality. No evidence of gallstones, gallbladder wall thickening or  biliary dilatation. Pancreas: Pancreas divisum noted. There is no significant pancreatic ductal dilatation or surrounding inflammatory change. Spleen: Normal in size without focal abnormality. Adrenals/Urinary Tract: Status post left adrenalectomy and nephrectomy. The right adrenal gland appears normal. The right kidney demonstrates numerous low-density lesions measuring up to 1.6 cm in the interpolar region. Those that are large enough to evaluate are consistent with cysts. Some are too small to optimally evaluate, although no suspicious findings are evident. No evidence of urinary tract calculus or hydronephrosis. The bladder is thick walled. Foley catheter is in place. Stomach/Bowel: No evidence of bowel wall thickening, distention or surrounding inflammatory change. The appendix appears normal. There are mild diverticular changes of the distal colon. Vascular/Lymphatic: There are no enlarged abdominal or pelvic lymph nodes. There is atherosclerosis of the aorta, its branches and the iliac arteries. Reproductive: Unremarkable. Other: No ascites or peritoneal nodularity. There are postsurgical changes within the anterior abdominal wall. Musculoskeletal: No acute or significant osseous findings. Degenerative changes noted at L5-S1. There is a broad-based right lateral disc protrusion at L2-3. There are postsurgical changes in the proximal left femur and a T8 hemangioma. IMPRESSION: 1. No acute findings or clear explanation for the patient's symptoms. There are degenerative changes in the lower lumbar spine with a right lateral disc protrusion at L2-3. 2. No evidence of metastatic disease status post left nephrectomy. 3. The right kidney demonstrates multiple low-density lesions which are likely cysts. Some of these are too small to optimally characterize. 4. Diffuse bladder wall thickening. 5. Moderate atherosclerosis. 6. Pancreas divisum without evidence of pancreatitis. Electronically Signed   By: Richardean Sale  M.D.   On: 01/15/2016 19:20   I have personally reviewed and evaluated these images and lab results as part of my medical decision-making.   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  CT abdomen and pelvis ordered and showed no acute findings. Urinalysis done after change of Foley showed no sign of UTI.  CMP shows chronic hyponatremia and chronic kidney disease.  Patient stable for discharge back to facility.  Pt chronic back pain likely exacerbating abdominal pain. No red flags. Patient discharged in stable condition with understanding of reasons to return.   Gareth Morgan, MD 01/16/16 [id number]  Gareth Morgan, MD 01/16/16 [phone]

## 2016-01-15 NOTE — ED Notes (Signed)
Patient dressed. 

## 2016-01-15 NOTE — ED Notes (Signed)
[facility] called  

## 2016-01-15 NOTE — ED Notes (Signed)
Pt transported to CT ?

## 2016-01-15 NOTE — ED Notes (Addendum)
Per [facility] - pt from [facility]. Pt c/o lower back pain (chronic issue but worse today) and RLQ abd pain. Abd pain worse sitting up than laying down. Pt able to tolerate PO food/fluids. BP 160/90, hr 75bpm. Pt has foley catheter. LBM today. Hx 4 abd surgeries, hepatitis C.

## 2016-01-25 NOTE — ED Provider Notes (Signed)
CSN: [id number]     Arrival date & time 01/12/16  1332 History   First MD Initiated Contact with Patient 01/12/16 1350     Chief Complaint  Patient presents with  . Back Pain  . Constipation     (Consider location/radiation/quality/duration/timing/severity/associated sxs/prior Treatment) HPI   65 year old male with increased lower back pain. Patient reports a past history of lower back pain. Previous combat injury, but denies any acute trauma. He feels like his pain is being undertreated at this facility. Describes pain across lower back. Does not lateralize or radiate. He feels like his pain may be worse related to recent changes in the weather. No fevers or chills.   Past Medical History  Diagnosis Date  . Hyponatremia   . Hypertension   . Hyperlipidemia   . BPH (benign prostatic hypertrophy)   . Hepatitis C   . COPD (chronic obstructive pulmonary disease) ([place])   . Chronic constipation   . Cancer ([place])   . Depression   . Insomnia   . Hypothyroidism   . Chronic pain    Past Surgical History  Procedure Laterality Date  . Adrenalectomy Left   . Nephrectomy Left    Family History  Problem Relation Age of Onset  . Family history unknown: Yes   Social History  Substance Use Topics  . Smoking status: Current Some Day Smoker    Types: Cigarettes  . Smokeless tobacco: None  . Alcohol Use: No    Review of Systems    Allergies  Review of patient's allergies indicates no known allergies.  Home Medications   Prior to Admission medications   Medication Sig Start Date End Date Taking? Authorizing Provider  amLODipine (NORVASC) 5 MG tablet Take 5 mg by mouth daily.   Yes Historical Provider, MD  aspirin 81 MG chewable tablet Chew 81 mg by mouth daily.   Yes Historical Provider, MD  calcium carbonate (TUMS - DOSED IN MG ELEMENTAL CALCIUM) 500 MG chewable tablet Chew 2 tablets by mouth every 6 (six) hours. For heartburn   Yes Historical Provider, MD  cetirizine (ZYRTEC)  10 MG tablet Take 10 mg by mouth daily.   Yes Historical Provider, MD  dextromethorphan-guaiFENesin (MUCINEX DM) 30-600 MG 12hr tablet Take 2 tablets by mouth every 12 (twelve) hours as needed for cough.   Yes Historical Provider, MD  docusate sodium (COLACE) 100 MG capsule Take 200 mg by mouth daily.   Yes Historical Provider, MD  donepezil (ARICEPT) 5 MG tablet Take 5 mg by mouth at bedtime.   Yes Historical Provider, MD  finasteride (PROSCAR) 5 MG tablet Take 5 mg by mouth daily.   Yes Historical Provider, MD  folic acid (FOLVITE) 1 MG tablet Take 1 mg by mouth daily.   Yes Historical Provider, MD  HYDROcodone-acetaminophen (NORCO/VICODIN) 5-325 MG tablet Take 1 tablet by mouth 2 (two) times daily. 8am, 8pm   Yes Historical Provider, MD  lactulose (CHRONULAC) 10 GM/15ML solution Take 20 g by mouth See admin instructions. Take 30 mls (20 g) by mouth twice daily at 8am and 8pm, may take one additional dose as needed for constipation   Yes Historical Provider, MD  levothyroxine (SYNTHROID, LEVOTHROID) 25 MCG tablet Take 25 mcg by mouth daily.    Yes Historical Provider, MD  mirtazapine (REMERON) 30 MG tablet Take 30 mg by mouth at bedtime.   Yes Historical Provider, MD  polyethylene glycol powder (MIRALAX) powder Take 17 g by mouth daily. Patient taking differently: Take 17 g by  mouth daily. Mix in 8 oz of liquid and drink 10/05/15  Yes [name], MD  sodium chloride 1 G tablet Take 1 g by mouth 2 (two) times daily. 8am and 2pm   Yes Historical Provider, MD  tamsulosin (FLOMAX) 0.4 MG CAPS capsule Take 0.4 mg by mouth daily.   Yes Historical Provider, MD  temazepam (RESTORIL) 30 MG capsule Take 30 mg by mouth at bedtime.    Yes Historical Provider, MD  thiamine (VITAMIN B-1) 100 MG tablet Take 100 mg by mouth daily.   Yes Historical Provider, MD  tiotropium (SPIRIVA) 18 MCG inhalation capsule Place 18 mcg into inhaler and inhale daily.   Yes Historical Provider, MD  albuterol (PROAIR HFA) 108 (90  Base) MCG/ACT inhaler Inhale 2 puffs into the lungs every 6 (six) hours as needed for wheezing or shortness of breath.    Historical Provider, MD  meloxicam (MOBIC) 7.5 MG tablet Take 1 tablet (7.5 mg total) by mouth daily. 01/12/16   Virgel Manifold, MD  Menthol, Topical Analgesic, (BENGAY EX) Apply 1 application topically 3 (three) times daily.    Historical Provider, MD   BP 135/97 mmHg  Pulse 65  Temp(Src) 98.3 F (36.8 C) (Oral)  Resp 18  SpO2 100% Physical Exam  Constitutional: He appears well-developed and well-nourished. No distress.  Laying in bed. No acute distress.  HENT:  Head: Normocephalic and atraumatic.  Eyes: Conjunctivae are normal. Right eye exhibits no discharge. Left eye exhibits no discharge.  Neck: Neck supple.  Cardiovascular: Normal rate, regular rhythm and normal heart sounds.  Exam reveals no gallop and no friction rub.   No murmur heard. Pulmonary/Chest: Effort normal and breath sounds normal. No respiratory distress.  Abdominal: Soft. He exhibits no distension. There is no tenderness.  Musculoskeletal: He exhibits no edema or tenderness.  Neurological: He is alert.  Somewhat stuttering speech but answers questions appropriately.  Skin: Skin is warm and dry.  Psychiatric: He has a normal mood and affect. His behavior is normal. Thought content normal.  Nursing note and vitals reviewed.   ED Course  Procedures (including critical care time) Labs Review Labs Reviewed - No data to display  Imaging Review No results found. I have personally reviewed and evaluated these images and lab results as part of my medical decision-making.   EKG Interpretation None      MDM   Final diagnoses:  Low back pain without sciatica, unspecified back pain laterality   65 year old male with acute on chronic lower back pain. Denies any acute injury. He has no acute neurological complaints. He feels like his pain is not being adequately addressed at his facility. He was  treated symptomatically in the emergency room some improvement of the symptoms. Let us short course meloxicam. Low suspicion for cord compression or other emergent pathology.   Virgel Manifold, MD 01/25/16 1300

## 2016-10-14 ENCOUNTER — Emergency Department (HOSPITAL_COMMUNITY)
Admission: EM | Admit: 2016-10-14 | Discharge: 2016-10-14 | Disposition: A | Discharge status: Home / Self Care | Payer: Medicare Other | Attending: Emergency Medicine | Admitting: Emergency Medicine

## 2016-10-14 ENCOUNTER — Encounter (HOSPITAL_COMMUNITY): Payer: Self-pay | Admitting: Emergency Medicine

## 2016-10-14 DIAGNOSIS — E871 Hypo-osmolality and hyponatremia: Secondary | ICD-10-CM | POA: Diagnosis not present

## 2016-10-14 DIAGNOSIS — Z7982 Long term (current) use of aspirin: Secondary | ICD-10-CM | POA: Insufficient documentation

## 2016-10-14 DIAGNOSIS — N289 Disorder of kidney and ureter, unspecified: Secondary | ICD-10-CM | POA: Diagnosis not present

## 2016-10-14 DIAGNOSIS — E039 Hypothyroidism, unspecified: Secondary | ICD-10-CM | POA: Insufficient documentation

## 2016-10-14 DIAGNOSIS — N183 Chronic kidney disease, stage 3 (moderate): Secondary | ICD-10-CM | POA: Insufficient documentation

## 2016-10-14 DIAGNOSIS — J449 Chronic obstructive pulmonary disease, unspecified: Secondary | ICD-10-CM | POA: Diagnosis not present

## 2016-10-14 DIAGNOSIS — R109 Unspecified abdominal pain: Secondary | ICD-10-CM | POA: Diagnosis present

## 2016-10-14 DIAGNOSIS — Z79899 Other long term (current) drug therapy: Secondary | ICD-10-CM | POA: Insufficient documentation

## 2016-10-14 DIAGNOSIS — I129 Hypertensive chronic kidney disease with stage 1 through stage 4 chronic kidney disease, or unspecified chronic kidney disease: Secondary | ICD-10-CM | POA: Insufficient documentation

## 2016-10-14 DIAGNOSIS — Z859 Personal history of malignant neoplasm, unspecified: Secondary | ICD-10-CM | POA: Diagnosis not present

## 2016-10-14 DIAGNOSIS — N189 Chronic kidney disease, unspecified: Secondary | ICD-10-CM

## 2016-10-14 DIAGNOSIS — F17210 Nicotine dependence, cigarettes, uncomplicated: Secondary | ICD-10-CM | POA: Insufficient documentation

## 2016-10-14 DIAGNOSIS — N3000 Acute cystitis without hematuria: Secondary | ICD-10-CM | POA: Diagnosis not present

## 2016-10-14 LAB — URINE MICROSCOPIC-ADD ON

## 2016-10-14 LAB — URINALYSIS, ROUTINE W REFLEX MICROSCOPIC
BILIRUBIN URINE: NEGATIVE
Glucose, UA: NEGATIVE mg/dL
Ketones, ur: NEGATIVE mg/dL
Nitrite: NEGATIVE
PH: 7 (ref 5.0–8.0)
Protein, ur: NEGATIVE mg/dL
SPECIFIC GRAVITY, URINE: 1.006 (ref 1.005–1.030)

## 2016-10-14 LAB — COMPREHENSIVE METABOLIC PANEL
ALT: 31 U/L (ref 17–63)
AST: 28 U/L (ref 15–41)
Albumin: 4 g/dL (ref 3.5–5.0)
Alkaline Phosphatase: 93 U/L (ref 38–126)
Anion gap: 9 (ref 5–15)
BUN: 27 mg/dL — ABNORMAL HIGH (ref 6–20)
CHLORIDE: 99 mmol/L — AB (ref 101–111)
CO2: 21 mmol/L — ABNORMAL LOW (ref 22–32)
CREATININE: 1.84 mg/dL — AB (ref 0.61–1.24)
Calcium: 9.1 mg/dL (ref 8.9–10.3)
GFR calc non Af Amer: 37 mL/min — ABNORMAL LOW (ref >60)
GFR, EST AFRICAN AMERICAN: 42 mL/min — AB (ref >60)
Glucose, Bld: 98 mg/dL (ref 65–99)
POTASSIUM: 4.1 mmol/L (ref 3.5–5.1)
SODIUM: 129 mmol/L — AB (ref 135–145)
Total Bilirubin: 0.3 mg/dL (ref 0.3–1.2)
Total Protein: 8.5 g/dL — ABNORMAL HIGH (ref 6.5–8.1)

## 2016-10-14 LAB — CBC
HEMATOCRIT: 37.9 % — AB (ref 39.0–52.0)
HEMOGLOBIN: 13.4 g/dL (ref 13.0–17.0)
MCH: 29.4 pg (ref 26.0–34.0)
MCHC: 35.4 g/dL (ref 30.0–36.0)
MCV: 83.1 fL (ref 78.0–100.0)
Platelets: 281 10*3/uL (ref 150–400)
RBC: 4.56 MIL/uL (ref 4.22–5.81)
RDW: 13 % (ref 11.5–15.5)
WBC: 8.2 10*3/uL (ref 4.0–10.5)

## 2016-10-14 LAB — LIPASE, BLOOD: LIPASE: 23 U/L (ref 11–51)

## 2016-10-14 MED ORDER — PHENAZOPYRIDINE HCL 200 MG PO TABS: 200.0000 mg | ORAL_TABLET | Freq: Three times a day (TID) | ORAL | Status: DC

## 2016-10-14 MED ORDER — MORPHINE SULFATE (PF) 4 MG/ML IV SOLN
4.0000 mg | Freq: Once | INTRAVENOUS | Status: AC
  Administered 2016-10-14: 4 mg via INTRAMUSCULAR
  Filled 2016-10-14: qty 1

## 2016-10-14 MED ORDER — ONDANSETRON 4 MG PO TBDP
4.0000 mg | ORAL_TABLET | Freq: Once | ORAL | Status: AC
  Administered 2016-10-14: 4 mg via ORAL
  Filled 2016-10-14: qty 1

## 2016-10-14 MED ORDER — LIDOCAINE HCL 1 % IJ SOLN
INTRAMUSCULAR | Status: AC
Start: 2016-10-14 — End: 2016-10-14
  Administered 2016-10-14: 20 mL
  Filled 2016-10-14: qty 20

## 2016-10-14 MED ORDER — CEPHALEXIN 500 MG PO CAPS: 500.0000 mg | ORAL_CAPSULE | Freq: Four times a day (QID) | ORAL | 0 refills | Status: DC

## 2016-10-14 MED ORDER — CEFTRIAXONE SODIUM 1 G IJ SOLR
1.0000 g | Freq: Once | INTRAMUSCULAR | Status: AC
  Administered 2016-10-14: 1 g via INTRAMUSCULAR
  Filled 2016-10-14: qty 10

## 2016-10-14 MED ORDER — PHENAZOPYRIDINE HCL 200 MG PO TABS: 200.0000 mg | ORAL_TABLET | Freq: Three times a day (TID) | ORAL | 0 refills | Status: DC

## 2016-10-14 NOTE — ED Triage Notes (Signed)
Pt is in via EMS from [facility]

## 2016-10-14 NOTE — ED Triage Notes (Signed)
States he has been having right sided abdominal pain since yesterday evening. Reports he was given a hydrocodone this morning  for pain and it was effective but is wearing off. States is having some nausea but no vomitting or diarrhea. States he has had kidney pain on his left side and states it feels like that this time. States the pain is RLQ radiating to the lower back. States he will have the urge to urinate with no urination.

## 2016-10-14 NOTE — ED Provider Notes (Signed)
[facility] DEPT Provider Note   CSN: [id number] Arrival date & time: 10/14/16  1727     History   Chief Complaint Chief Complaint  Patient presents with  . Abdominal Pain    HPI Luke Wiggins is a 65 y.o. male.  Pt presents to the ED today with abdominal pain.  He said that he thinks he has a urinary tract infection.  He said that he took a pain pill at the assisted living facility which helped his pain, but the pain came back.  The pt used to have to catheterize himself, but he no longer does.  The pt c/o back pain on the left, but has had a prior left nephrectomy.      Past Medical History:  Diagnosis Date  . BPH (benign prostatic hypertrophy)   . Cancer ([place])   . Chronic constipation   . Chronic pain   . COPD (chronic obstructive pulmonary disease) ([place])   . Depression   . Hepatitis C   . Hyperlipidemia   . Hypertension   . Hyponatremia   . Hypothyroidism   . Insomnia     Patient Active Problem List   Diagnosis Date Noted  . GERD (gastroesophageal reflux disease) 09/08/2015  . Constipation 09/08/2015  . Abdominal pain, generalized   . Essential hypertension   . Thyroid activity decreased   . Hydronephrosis 09/07/2015  . Abdominal pain 09/07/2015  . CKD (chronic kidney disease) stage 3, GFR 30-59 ml/min 09/07/2015  . Hypertension   . Hyperlipidemia   . BPH (benign prostatic hypertrophy)   . Hepatitis C   . COPD (chronic obstructive pulmonary disease) ([place])   . Depression   . Insomnia   . Hypothyroidism     Past Surgical History:  Procedure Laterality Date  . ADRENALECTOMY Left   . NEPHRECTOMY Left        Home Medications    Prior to Admission medications   Medication Sig Start Date End Date Taking? Authorizing Provider  albuterol (PROAIR HFA) 108 (90 Base) MCG/ACT inhaler Inhale 2 puffs into the lungs every 6 (six) hours as needed for wheezing or shortness of breath.    Historical Provider, MD  amLODipine (NORVASC) 5 MG tablet Take 5 mg  by mouth daily.    Historical Provider, MD  aspirin 81 MG chewable tablet Chew 81 mg by mouth daily.    Historical Provider, MD  calcium carbonate (TUMS - DOSED IN MG ELEMENTAL CALCIUM) 500 MG chewable tablet Chew 2 tablets by mouth every 6 (six) hours. For heartburn    Historical Provider, MD  cephALEXin (KEFLEX) 500 MG capsule Take 1 capsule (500 mg total) by mouth 4 (four) times daily. 10/14/16   Isla Pence, MD  cetirizine (ZYRTEC) 10 MG tablet Take 10 mg by mouth daily.    Historical Provider, MD  dextromethorphan-guaiFENesin (MUCINEX DM) 30-600 MG 12hr tablet Take 2 tablets by mouth every 12 (twelve) hours as needed for cough.    Historical Provider, MD  docusate sodium (COLACE) 100 MG capsule Take 200 mg by mouth daily.    Historical Provider, MD  donepezil (ARICEPT) 5 MG tablet Take 5 mg by mouth at bedtime.    Historical Provider, MD  finasteride (PROSCAR) 5 MG tablet Take 5 mg by mouth daily.    Historical Provider, MD  folic acid (FOLVITE) 1 MG tablet Take 1 mg by mouth daily.    Historical Provider, MD  HYDROcodone-acetaminophen (NORCO/VICODIN) 5-325 MG tablet Take 1 tablet by mouth 2 (two) times daily. 8am,  8pm    Historical Provider, MD  lactulose (CHRONULAC) 10 GM/15ML solution Take 20 g by mouth See admin instructions. Take 30 mls (20 g) by mouth twice daily at 8am and 8pm, may take one additional dose as needed for constipation    Historical Provider, MD  levothyroxine (SYNTHROID, LEVOTHROID) 25 MCG tablet Take 25 mcg by mouth daily.     Historical Provider, MD  meloxicam (MOBIC) 7.5 MG tablet Take 1 tablet (7.5 mg total) by mouth daily. 01/12/16   Virgel Manifold, MD  Menthol, Topical Analgesic, (BENGAY EX) Apply 1 application topically 3 (three) times daily.    Historical Provider, MD  mirtazapine (REMERON) 30 MG tablet Take 30 mg by mouth at bedtime.    Historical Provider, MD  phenazopyridine (PYRIDIUM) 200 MG tablet Take 1 tablet (200 mg total) by mouth 3 (three) times daily.  10/14/16   Isla Pence, MD  polyethylene glycol powder (MIRALAX) powder Take 17 g by mouth daily. Patient taking differently: Take 17 g by mouth daily. Mix in 8 oz of liquid and drink 10/05/15   [name], MD  sodium chloride 1 G tablet Take 1 g by mouth 2 (two) times daily. 8am and 2pm    Historical Provider, MD  tamsulosin (FLOMAX) 0.4 MG CAPS capsule Take 0.4 mg by mouth daily.    Historical Provider, MD  temazepam (RESTORIL) 30 MG capsule Take 30 mg by mouth at bedtime.     Historical Provider, MD  thiamine (VITAMIN B-1) 100 MG tablet Take 100 mg by mouth daily.    Historical Provider, MD  tiotropium (SPIRIVA) 18 MCG inhalation capsule Place 18 mcg into inhaler and inhale daily.    Historical Provider, MD    Family History Family History  Problem Relation Age of Onset  . Family history unknown: Yes    Social History Social History  Substance Use Topics  . Smoking status: Current Some Day Smoker    Types: Cigarettes  . Smokeless tobacco: Never Used  . Alcohol use No     Allergies   Review of patient's allergies indicates no known allergies.   Review of Systems Review of Systems  Gastrointestinal: Positive for abdominal pain.  Genitourinary: Positive for difficulty urinating, dysuria and frequency.  All other systems reviewed and are negative.    Physical Exam Updated Vital Signs BP 181/93 (BP Location: Left Arm)   Pulse 71   Temp 98.5 F (36.9 C) (Oral)   Resp 18   Wt 140 lb (63.5 kg)   SpO2 96%   BMI 21.93 kg/m   Physical Exam  Constitutional: He is oriented to person, place, and time. He appears well-developed and well-nourished.  HENT:  Head: Normocephalic and atraumatic.  Right Ear: External ear normal.  Left Ear: External ear normal.  Nose: Nose normal.  Mouth/Throat: Oropharynx is clear and moist.  Eyes: Conjunctivae and EOM are normal. Pupils are equal, round, and reactive to light.  Neck: Normal range of motion. Neck supple.    Cardiovascular: Normal rate, regular rhythm, normal heart sounds and intact distal pulses.   Pulmonary/Chest: Effort normal and breath sounds normal.  Abdominal: Soft. There is tenderness in the suprapubic area.  Musculoskeletal: Normal range of motion.  Neurological: He is alert and oriented to person, place, and time.  Skin: Skin is warm.  Psychiatric: He has a normal mood and affect. His behavior is normal. Judgment and thought content normal.  Nursing note and vitals reviewed.    ED Treatments / Results  Labs (  all labs ordered are listed, but only abnormal results are displayed) Labs Reviewed  COMPREHENSIVE METABOLIC PANEL - Abnormal; Notable for the following:       Result Value   Sodium 129 (*)    Chloride 99 (*)    CO2 21 (*)    BUN 27 (*)    Creatinine, Ser 1.84 (*)    Total Protein 8.5 (*)    GFR calc non Af Amer 37 (*)    GFR calc Af Amer 42 (*)    All other components within normal limits  CBC - Abnormal; Notable for the following:    HCT 37.9 (*)    All other components within normal limits  URINALYSIS, ROUTINE W REFLEX MICROSCOPIC (NOT AT [facility]) - Abnormal; Notable for the following:    Hgb urine dipstick TRACE (*)    Leukocytes, UA SMALL (*)    All other components within normal limits  URINE MICROSCOPIC-ADD ON - Abnormal; Notable for the following:    Squamous Epithelial / LPF 0-5 (*)    Bacteria, UA MANY (*)    All other components within normal limits  URINE CULTURE  LIPASE, BLOOD    EKG  EKG Interpretation None       Radiology No results found.  Procedures Procedures (including critical care time)  Medications Ordered in ED Medications  morphine 4 MG/ML injection 4 mg (not administered)  ondansetron (ZOFRAN-ODT) disintegrating tablet 4 mg (not administered)  cefTRIAXone (ROCEPHIN) injection 1 g (not administered)  phenazopyridine (PYRIDIUM) tablet 200 mg (not administered)     Initial Impression / Assessment and Plan / ED Course  I  have reviewed the triage vital signs and the nursing notes.  Pertinent labs & imaging results that were available during my care of the patient were reviewed by me and considered in my medical decision making (see chart for details).  Clinical Course   Pt is nontoxic in appearance.  He will get initial abx tx here and will be d/c'd home on abx.  I ordered a urine culture as well.  Hyponatremia is chronic.  Renal insufficiency is chronic.  Pt has pain meds at assisted living, so I won't add additional meds.   Pt knows to return if worse.  Final Clinical Impressions(s) / ED Diagnoses   Final diagnoses:  Acute cystitis without hematuria  Hyponatremia  Chronic renal impairment, unspecified CKD stage    New Prescriptions New Prescriptions   CEPHALEXIN (KEFLEX) 500 MG CAPSULE    Take 1 capsule (500 mg total) by mouth 4 (four) times daily.   PHENAZOPYRIDINE (PYRIDIUM) 200 MG TABLET    Take 1 tablet (200 mg total) by mouth 3 (three) times daily.     Isla Pence, MD 10/14/16 2017

## 2016-10-17 LAB — URINE CULTURE: Culture: >=100000 — AB

## 2016-10-18 ENCOUNTER — Telehealth (HOSPITAL_COMMUNITY): Payer: Self-pay

## 2016-10-18 NOTE — Telephone Encounter (Signed)
Post ED Visit - Positive Culture Follow-up: Successful Patient Follow-Up  Culture assessed and recommendations reviewed by: []  Elenor Quinones, Pharm.D. []  Heide Guile, Pharm.D., BCPS []  Parks Neptune, Pharm.D. []  Alycia Rossetti, Pharm.D., BCPS []  [place], Pharm.D., BCPS, AAHIVP []  Legrand Como, Pharm.D., BCPS, AAHIVP []  Cassie Stewart, Pharm.D. []  Rob Evette Doffing, Pharm.D. X  Renee Ackley, Pharm. D.  Positive urine culture>/= 100,000 colonies -> Diptheroids []  Patient discharged without antimicrobial prescription and treatment is now indicated []  Organism is resistant to prescribed ED discharge antimicrobial []  Patient with positive blood cultures  Changes discussed with ED provider: Lorre Munroe PA Call pt to asses symptoms.  If having urinary sx, return to ED or PCP.  If no, continue Keflex. New antibiotic prescription  Called to  Contacted patient, date 10/18/2016, time 1154  Results faxed to [facility] [phone].     Dortha Kern 10/18/2016, 12:04 PM

## 2016-10-22 ENCOUNTER — Emergency Department (HOSPITAL_COMMUNITY)
Admission: EM | Admit: 2016-10-22 | Discharge: 2016-10-22 | Disposition: A | Discharge status: Home / Self Care | Payer: Medicare Other | Attending: Emergency Medicine | Admitting: Emergency Medicine

## 2016-10-22 ENCOUNTER — Emergency Department (HOSPITAL_COMMUNITY): Payer: Medicare Other

## 2016-10-22 ENCOUNTER — Encounter (HOSPITAL_COMMUNITY): Payer: Self-pay | Admitting: Emergency Medicine

## 2016-10-22 DIAGNOSIS — Z859 Personal history of malignant neoplasm, unspecified: Secondary | ICD-10-CM | POA: Insufficient documentation

## 2016-10-22 DIAGNOSIS — Z7982 Long term (current) use of aspirin: Secondary | ICD-10-CM | POA: Insufficient documentation

## 2016-10-22 DIAGNOSIS — E039 Hypothyroidism, unspecified: Secondary | ICD-10-CM | POA: Diagnosis not present

## 2016-10-22 DIAGNOSIS — R109 Unspecified abdominal pain: Secondary | ICD-10-CM

## 2016-10-22 DIAGNOSIS — N183 Chronic kidney disease, stage 3 (moderate): Secondary | ICD-10-CM | POA: Diagnosis not present

## 2016-10-22 DIAGNOSIS — F17210 Nicotine dependence, cigarettes, uncomplicated: Secondary | ICD-10-CM | POA: Diagnosis not present

## 2016-10-22 DIAGNOSIS — I129 Hypertensive chronic kidney disease with stage 1 through stage 4 chronic kidney disease, or unspecified chronic kidney disease: Secondary | ICD-10-CM | POA: Insufficient documentation

## 2016-10-22 DIAGNOSIS — Z79899 Other long term (current) drug therapy: Secondary | ICD-10-CM | POA: Insufficient documentation

## 2016-10-22 DIAGNOSIS — J449 Chronic obstructive pulmonary disease, unspecified: Secondary | ICD-10-CM | POA: Diagnosis not present

## 2016-10-22 LAB — CBC WITH DIFFERENTIAL/PLATELET
Basophils Absolute: 0 10*3/uL (ref 0.0–0.1)
Basophils Relative: 0 %
EOS ABS: 0.3 10*3/uL (ref 0.0–0.7)
EOS PCT: 3 %
HCT: 41.4 % (ref 39.0–52.0)
HEMOGLOBIN: 13.9 g/dL (ref 13.0–17.0)
Lymphocytes Relative: 22 %
Lymphs Abs: 1.9 10*3/uL (ref 0.7–4.0)
MCH: 29.1 pg (ref 26.0–34.0)
MCHC: 33.6 g/dL (ref 30.0–36.0)
MCV: 86.6 fL (ref 78.0–100.0)
Monocytes Absolute: 0.5 10*3/uL (ref 0.1–1.0)
Monocytes Relative: 6 %
NEUTROS PCT: 69 %
Neutro Abs: 6.1 10*3/uL (ref 1.7–7.7)
PLATELETS: 258 10*3/uL (ref 150–400)
RBC: 4.78 MIL/uL (ref 4.22–5.81)
RDW: 13.4 % (ref 11.5–15.5)
WBC: 8.8 10*3/uL (ref 4.0–10.5)

## 2016-10-22 LAB — URINALYSIS, ROUTINE W REFLEX MICROSCOPIC
BILIRUBIN URINE: NEGATIVE
Glucose, UA: NEGATIVE mg/dL
HGB URINE DIPSTICK: NEGATIVE
KETONES UR: NEGATIVE mg/dL
Leukocytes, UA: NEGATIVE
NITRITE: NEGATIVE
PROTEIN: 30 mg/dL — AB
SPECIFIC GRAVITY, URINE: 1.014 (ref 1.005–1.030)
pH: 7.5 (ref 5.0–8.0)

## 2016-10-22 LAB — COMPREHENSIVE METABOLIC PANEL
ALBUMIN: 4.1 g/dL (ref 3.5–5.0)
ALK PHOS: 84 U/L (ref 38–126)
ALT: 26 U/L (ref 17–63)
AST: 25 U/L (ref 15–41)
Anion gap: 7 (ref 5–15)
BILIRUBIN TOTAL: 0.7 mg/dL (ref 0.3–1.2)
BUN: 24 mg/dL — AB (ref 6–20)
CO2: 22 mmol/L (ref 22–32)
Calcium: 9.3 mg/dL (ref 8.9–10.3)
Chloride: 102 mmol/L (ref 101–111)
Creatinine, Ser: 1.89 mg/dL — ABNORMAL HIGH (ref 0.61–1.24)
GFR calc Af Amer: 41 mL/min — ABNORMAL LOW (ref >60)
GFR, EST NON AFRICAN AMERICAN: 35 mL/min — AB (ref >60)
Glucose, Bld: 84 mg/dL (ref 65–99)
POTASSIUM: 5.1 mmol/L (ref 3.5–5.1)
SODIUM: 131 mmol/L — AB (ref 135–145)
TOTAL PROTEIN: 8.1 g/dL (ref 6.5–8.1)

## 2016-10-22 LAB — URINE MICROSCOPIC-ADD ON

## 2016-10-22 MED ORDER — FENTANYL CITRATE (PF) 100 MCG/2ML IJ SOLN
50.0000 ug | Freq: Once | INTRAMUSCULAR | Status: AC
  Administered 2016-10-22: 50 ug via INTRAVENOUS
  Filled 2016-10-22: qty 2

## 2016-10-22 NOTE — ED Notes (Signed)
Pt provided with apple juice and crackers.

## 2016-10-22 NOTE — ED Notes (Signed)
US at bedside

## 2016-10-22 NOTE — ED Notes (Signed)
Blood return noted during IV insertion but not enough for blood work.

## 2016-10-22 NOTE — ED Notes (Signed)
2 failed attmpt to collect labs 

## 2016-10-22 NOTE — ED Triage Notes (Addendum)
Per EMS pt from [facility] with complaint of intermittent bilateral flank pain onset last night. Pt just finished round of antibiotics for confirmed UTI. Pt denies n/v.

## 2016-10-22 NOTE — ED Notes (Signed)
Pt aware of need for urine sample. Urinal at bedside. Pt attempting to provide sample at this time.

## 2016-10-22 NOTE — ED Notes (Signed)
Morris two unsuccessful IV attempts. This RN able to start IV; see documentation, but not sufficient blood return to collect labs as ordered. Attempt to straight stick post IV insertion unsuccessful. Jonna NT reports will attempt lab draw.

## 2016-10-22 NOTE — ED Notes (Signed)
Spoke with Jerral Bonito from [facility]. Verbalizes understanding of discharge paperwork.

## 2016-10-22 NOTE — ED Provider Notes (Addendum)
[facility] DEPT Provider Note   CSN: [id number] Arrival date & time: 10/22/16  1115     History   Chief Complaint Chief Complaint  Patient presents with  . Flank Pain    HPI Luke Wiggins is a 65 y.o. male.  The history is provided by the patient.  Flank Pain  This is a recurrent problem. The current episode started more than 1 week ago. The problem occurs constantly. The problem has been gradually worsening. Pertinent negatives include no chest pain, no abdominal pain and no shortness of breath. Nothing aggravates the symptoms. Nothing relieves the symptoms. Treatments tried: ABx. The treatment provided mild relief.   Seen on 10/15 and diagnosed with UTI. Given Keflex. Pain not resolving.   H/o renal Ca s/p left nephrectomy.   Past Medical History:  Diagnosis Date  . BPH (benign prostatic hypertrophy)   . Cancer ([place])   . Chronic constipation   . Chronic pain   . COPD (chronic obstructive pulmonary disease) ([place])   . Depression   . Hepatitis C   . Hyperlipidemia   . Hypertension   . Hyponatremia   . Hypothyroidism   . Insomnia     Patient Active Problem List   Diagnosis Date Noted  . GERD (gastroesophageal reflux disease) 09/08/2015  . Constipation 09/08/2015  . Abdominal pain, generalized   . Essential hypertension   . Thyroid activity decreased   . Hydronephrosis 09/07/2015  . Abdominal pain 09/07/2015  . CKD (chronic kidney disease) stage 3, GFR 30-59 ml/min 09/07/2015  . Hypertension   . Hyperlipidemia   . BPH (benign prostatic hypertrophy)   . Hepatitis C   . COPD (chronic obstructive pulmonary disease) ([place])   . Depression   . Insomnia   . Hypothyroidism     Past Surgical History:  Procedure Laterality Date  . ADRENALECTOMY Left   . NEPHRECTOMY Left        Home Medications    Prior to Admission medications   Medication Sig Start Date End Date Taking? Authorizing Provider  albuterol (PROAIR HFA) 108 (90 Base) MCG/ACT inhaler Inhale 2  puffs into the lungs every 6 (six) hours as needed for wheezing or shortness of breath.   Yes Historical Provider, MD  amLODipine (NORVASC) 5 MG tablet Take 5 mg by mouth daily.   Yes Historical Provider, MD  aspirin 81 MG chewable tablet Chew 81 mg by mouth daily.   Yes Historical Provider, MD  cephALEXin (KEFLEX) 500 MG capsule Take 1 capsule (500 mg total) by mouth 4 (four) times daily. 10/14/16  Yes Isla Pence, MD  cetirizine (ZYRTEC) 10 MG tablet Take 10 mg by mouth daily.   Yes Historical Provider, MD  dextromethorphan-guaiFENesin (MUCINEX DM) 30-600 MG 12hr tablet Take 2 tablets by mouth every 12 (twelve) hours as needed for cough.   Yes Historical Provider, MD  docusate sodium (COLACE) 100 MG capsule Take 200 mg by mouth daily.   Yes Historical Provider, MD  finasteride (PROSCAR) 5 MG tablet Take 5 mg by mouth daily.   Yes Historical Provider, MD  HYDROcodone-acetaminophen (NORCO/VICODIN) 5-325 MG tablet Take 1 tablet by mouth 3 (three) times daily. 8am,2pm,& 8pm   Yes Historical Provider, MD  lactulose (CHRONULAC) 10 GM/15ML solution Take 20 g by mouth 2 (two) times daily. Take 30 mls (20 g) by mouth twice daily at 8am and 8pm, may take one additional dose as needed for constipation   Yes Historical Provider, MD  levothyroxine (SYNTHROID, LEVOTHROID) 25 MCG tablet Take 25  mcg by mouth daily.    Yes Historical Provider, MD  Menthol-Methyl Salicylate (MUSCLE RUB EX) Apply 1 application topically 3 (three) times daily.   Yes Historical Provider, MD  mirtazapine (REMERON) 30 MG tablet Take 30 mg by mouth at bedtime.   Yes Historical Provider, MD  omeprazole (PRILOSEC) 20 MG capsule Take 20 mg by mouth daily.   Yes Historical Provider, MD  polyethylene glycol powder (MIRALAX) powder Take 17 g by mouth daily. Patient taking differently: Take 17 g by mouth daily. Mix in 8 oz of liquid and drink 10/05/15  Yes [name], MD  QUEtiapine (SEROQUEL) 100 MG tablet Take 100 mg by mouth at bedtime.    Yes Historical Provider, MD  sodium chloride 1 G tablet Take 1 g by mouth 2 (two) times daily. 8am and 2pm   Yes Historical Provider, MD  tamsulosin (FLOMAX) 0.4 MG CAPS capsule Take 0.4 mg by mouth daily.   Yes Historical Provider, MD  temazepam (RESTORIL) 30 MG capsule Take 30 mg by mouth at bedtime.    Yes Historical Provider, MD  tiotropium (SPIRIVA) 18 MCG inhalation capsule Place 18 mcg into inhaler and inhale daily.   Yes Historical Provider, MD  meloxicam (MOBIC) 7.5 MG tablet Take 1 tablet (7.5 mg total) by mouth daily. Patient not taking: Reported on 10/22/2016 01/12/16   Virgel Manifold, MD  phenazopyridine (PYRIDIUM) 200 MG tablet Take 1 tablet (200 mg total) by mouth 3 (three) times daily. Patient not taking: Reported on 10/22/2016 10/14/16   Isla Pence, MD    Family History Family History  Problem Relation Age of Onset  . Family history unknown: Yes    Social History Social History  Substance Use Topics  . Smoking status: Current Some Day Smoker    Types: Cigarettes  . Smokeless tobacco: Never Used  . Alcohol use No     Allergies   Review of patient's allergies indicates no known allergies.   Review of Systems Review of Systems  Constitutional: Negative for chills and fever.  HENT: Negative for ear pain and sore throat.   Eyes: Negative for pain and visual disturbance.  Respiratory: Negative for cough and shortness of breath.   Cardiovascular: Negative for chest pain and palpitations.  Gastrointestinal: Negative for abdominal pain and vomiting.  Genitourinary: Positive for flank pain. Negative for dysuria and hematuria.  Musculoskeletal: Negative for arthralgias and back pain.  Skin: Negative for color change and rash.  Neurological: Negative for seizures and syncope.  All other systems reviewed and are negative.    Physical Exam Updated Vital Signs BP 148/88 (BP Location: Left Arm)   Pulse 66   Temp 98.1 F (36.7 C) (Oral)   Resp 16   SpO2 94%    Physical Exam  Constitutional: He is oriented to person, place, and time. He appears well-developed and well-nourished. No distress.  HENT:  Head: Normocephalic and atraumatic.  Nose: Nose normal.  Eyes: Conjunctivae and EOM are normal. Pupils are equal, round, and reactive to light. Right eye exhibits no discharge. Left eye exhibits no discharge. No scleral icterus.  Neck: Normal range of motion. Neck supple.  Cardiovascular: Normal rate and regular rhythm.  Exam reveals no gallop and no friction rub.   No murmur heard. Pulmonary/Chest: Effort normal and breath sounds normal. No stridor. No respiratory distress. He has no rales.  Abdominal: Soft. He exhibits no distension. There is no tenderness.  Musculoskeletal: He exhibits no edema.       Lumbar back: He  exhibits tenderness.       Back:  Neurological: He is alert and oriented to person, place, and time.  Skin: Skin is warm and dry. No rash noted. He is not diaphoretic. No erythema.  Psychiatric: He has a normal mood and affect.  Vitals reviewed.    ED Treatments / Results  Labs (all labs ordered are listed, but only abnormal results are displayed) Labs Reviewed  URINALYSIS, ROUTINE W REFLEX MICROSCOPIC (NOT AT [facility]) - Abnormal; Notable for the following:       Result Value   Protein, ur 30 (*)    All other components within normal limits  COMPREHENSIVE METABOLIC PANEL - Abnormal; Notable for the following:    Sodium 131 (*)    BUN 24 (*)    Creatinine, Ser 1.89 (*)    GFR calc non Af Amer 35 (*)    GFR calc Af Amer 41 (*)    All other components within normal limits  URINE MICROSCOPIC-ADD ON - Abnormal; Notable for the following:    Squamous Epithelial / LPF 0-5 (*)    Bacteria, UA RARE (*)    All other components within normal limits  CBC WITH DIFFERENTIAL/PLATELET    EKG  EKG Interpretation None       Radiology US Renal  Result Date: 10/22/2016 CLINICAL DATA:  Right-sided flank pain EXAM: RENAL /  URINARY TRACT ULTRASOUND COMPLETE COMPARISON:  01/15/2016 FINDINGS: Right Kidney: Length: 10.0 cm. 9 mm cyst is noted within the mid to upper pole of the right kidney. A second slightly larger 1.3 cm complex cystic lesion is noted. These are similar to those seen on prior CT examination. Left Kidney: Surgically removed Bladder: Appears normal for degree of bladder distention. IMPRESSION: Status post left nephrectomy Stable cystic lesions in the right kidney similar to those seen on prior CT examination. Continued follow-up is recommended in 6-12 months with ultrasound. Electronically Signed   By: Inez Catalina M.D.   On: 10/22/2016 13:57     CT from 1/17 w/o AAA.  No evidence of metastatic disease status post left nephrectomy.  The right kidney demonstrates multiple low-density lesions which are likely cysts. Some of these are too small to optimally Characterize.  Procedures Procedures (including critical care time)  Medications Ordered in ED Medications  fentaNYL (SUBLIMAZE) injection 50 mcg (50 mcg Intravenous Given 10/22/16 1252)     Initial Impression / Assessment and Plan / ED Course  I have reviewed the triage vital signs and the nursing notes.  Pertinent labs & imaging results that were available during my care of the patient were reviewed by me and considered in my medical decision making (see chart for details).  Clinical Course    Labs with stable renal function. Chronic hyponatremia at baseline. No evidence of urinary tract infection. Ultrasound obtained with stable right renal lesions without evidence of stone or hydronephrosis. Likely MSK pain.  Safe for discharge with strict return precautions.  Final Clinical Impressions(s) / ED Diagnoses   Final diagnoses:  Right flank pain   Disposition: Discharge  Condition: Good  I have discussed the results, Dx and Tx plan with the patient who expressed understanding and agree(s) with the plan. Discharge instructions  discussed at great length. The patient was given strict return precautions who verbalized understanding of the instructions. No further questions at time of discharge.    Current Discharge Medication List      Follow Up: Mabeline Caras, NP [address] [place] [address] [phone]  Schedule an appointment as soon as possible for a visit  As needed      Fatima Blank, MD 10/22/16 1640

## 2016-10-22 NOTE — ED Notes (Signed)
Luke Wiggins reports unsuccessful attempts to collect labs and verbalizes has alerted phlebotomy staff.

## 2016-10-22 NOTE — ED Notes (Signed)
Bed: WA08 Expected date:  Expected time:  Means of arrival:  Comments: EMS bladder infection

## 2017-08-12 IMAGING — CR DG ABDOMEN 1V
1 series · 1 of 1 positions shown · non-contrast
Comparison: None.

CLINICAL DATA: Difficulty urinating and constipation.

EXAM:
ABDOMEN - 1 VIEW

[x abdomen supine]
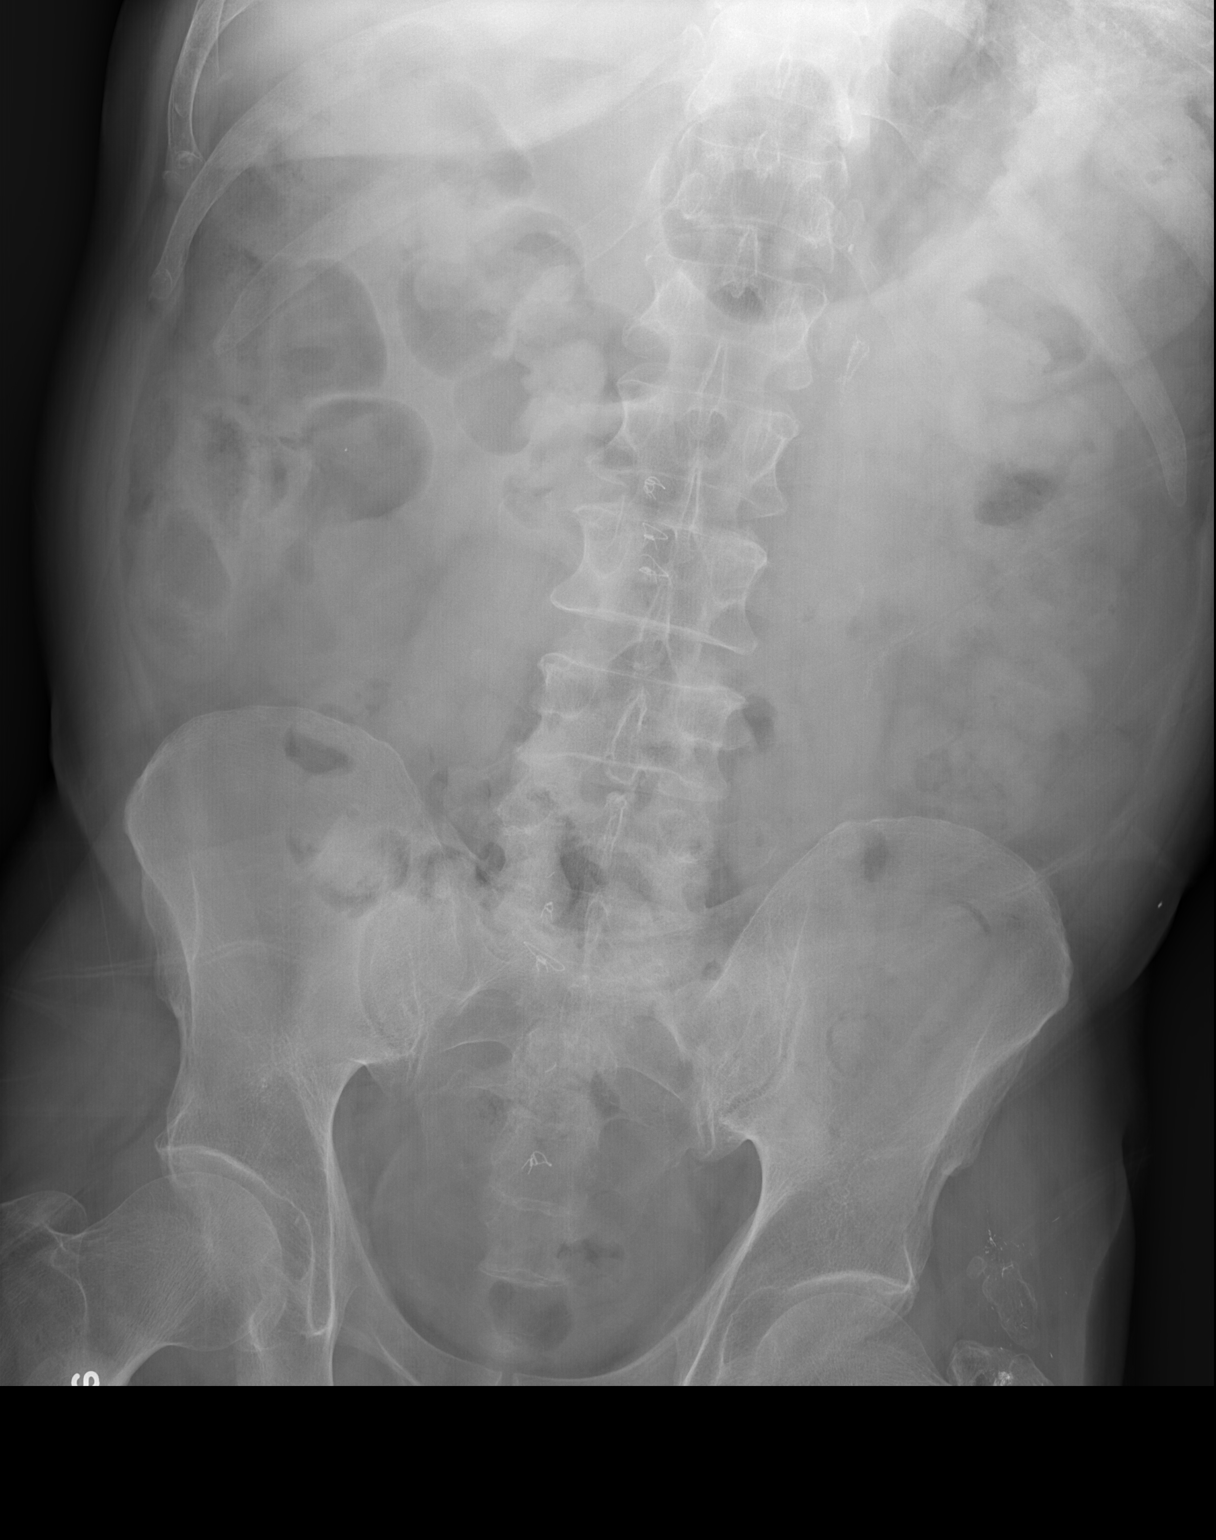

[1 of 1 positions shown; findings below may reference images not displayed]

FINDINGS: Bowel gas pattern appears nonobstructive. No evidence of soft tissue
mass or abnormal fluid collection seen. Bladder does, however,
appear moderately distended within the pelvis. No evidence of free
intraperitoneal air.

Surgical sutures are seen along the midline abdomen and pelvis. Mild
degenerative change noted within the lower lumbar spine. No acute
osseous abnormality seen.
IMPRESSION: Nonobstructive bowel gas pattern and no evidence of acute
intra-abdominal abnormality.

Bladder appears to be at least moderately distended within the
pelvis, compatible with given clinical data of difficulty urinating.

## 2018-02-17 ENCOUNTER — Other Ambulatory Visit (HOSPITAL_COMMUNITY): Payer: Self-pay | Admitting: Internal Medicine

## 2018-02-17 DIAGNOSIS — I2699 Other pulmonary embolism without acute cor pulmonale: Secondary | ICD-10-CM

## 2018-02-18 ENCOUNTER — Encounter (HOSPITAL_COMMUNITY): Payer: Self-pay

## 2018-02-18 ENCOUNTER — Ambulatory Visit (HOSPITAL_COMMUNITY)
Admission: RE | Admit: 2018-02-18 | Discharge: 2018-02-18 | Disposition: A | Discharge status: Home / Self Care | Payer: Medicare Other | Source: Ambulatory Visit | Attending: Internal Medicine | Admitting: Internal Medicine

## 2018-02-18 DIAGNOSIS — I2699 Other pulmonary embolism without acute cor pulmonale: Secondary | ICD-10-CM

## 2018-02-19 ENCOUNTER — Other Ambulatory Visit (HOSPITAL_COMMUNITY): Payer: Self-pay | Admitting: Internal Medicine

## 2018-02-19 DIAGNOSIS — R079 Chest pain, unspecified: Secondary | ICD-10-CM

## 2018-02-20 ENCOUNTER — Encounter (HOSPITAL_COMMUNITY)
Admission: RE | Admit: 2018-02-20 | Discharge: 2018-02-20 | Disposition: A | Discharge status: Home / Self Care | Payer: Medicare Other | Source: Ambulatory Visit | Attending: Internal Medicine | Admitting: Internal Medicine

## 2018-02-20 ENCOUNTER — Ambulatory Visit (HOSPITAL_COMMUNITY)
Admission: RE | Admit: 2018-02-20 | Discharge: 2018-02-20 | Disposition: A | Discharge status: Home / Self Care | Payer: Medicare Other | Source: Ambulatory Visit | Attending: Internal Medicine | Admitting: Internal Medicine

## 2018-02-20 DIAGNOSIS — J9811 Atelectasis: Secondary | ICD-10-CM | POA: Insufficient documentation

## 2018-02-20 DIAGNOSIS — R079 Chest pain, unspecified: Secondary | ICD-10-CM | POA: Insufficient documentation

## 2018-02-20 DIAGNOSIS — J984 Other disorders of lung: Secondary | ICD-10-CM | POA: Diagnosis not present

## 2018-02-20 MED ORDER — TECHNETIUM TO 99M ALBUMIN AGGREGATED
4.1000 | Freq: Once | INTRAVENOUS | Status: AC | PRN
  Administered 2018-02-20: 4.1 via INTRAVENOUS

## 2018-02-20 MED ORDER — TECHNETIUM TC 99M DIETHYLENETRIAME-PENTAACETIC ACID
30.1000 | Freq: Once | INTRAVENOUS | Status: AC | PRN
  Administered 2018-02-20: 30.1 via RESPIRATORY_TRACT

## 2018-02-21 ENCOUNTER — Other Ambulatory Visit (HOSPITAL_COMMUNITY): Payer: Self-pay | Admitting: Internal Medicine

## 2018-02-21 DIAGNOSIS — R079 Chest pain, unspecified: Secondary | ICD-10-CM

## 2018-02-25 ENCOUNTER — Ambulatory Visit (HOSPITAL_COMMUNITY)
Admission: RE | Admit: 2018-02-25 | Discharge: 2018-02-25 | Disposition: A | Discharge status: Home / Self Care | Payer: Medicare Other | Source: Ambulatory Visit | Attending: Internal Medicine | Admitting: Internal Medicine

## 2018-02-25 ENCOUNTER — Encounter (HOSPITAL_COMMUNITY)
Admission: RE | Admit: 2018-02-25 | Discharge: 2018-02-25 | Disposition: A | Discharge status: Home / Self Care | Payer: Medicare Other | Source: Ambulatory Visit | Attending: Internal Medicine | Admitting: Internal Medicine

## 2018-02-25 DIAGNOSIS — R079 Chest pain, unspecified: Secondary | ICD-10-CM

## 2018-02-25 MED ORDER — TECHNETIUM TC 99M DIETHYLENETRIAME-PENTAACETIC ACID
32.0000 | Freq: Once | INTRAVENOUS | Status: AC | PRN
  Administered 2018-02-25: 32 via INTRAVENOUS

## 2018-02-25 MED ORDER — TECHNETIUM TO 99M ALBUMIN AGGREGATED
4.0000 | Freq: Once | INTRAVENOUS | Status: AC | PRN
  Administered 2018-02-25: 4 via INTRAVENOUS

## 2019-12-24 ENCOUNTER — Other Ambulatory Visit: Payer: Self-pay | Admitting: *Deleted

## 2019-12-24 ENCOUNTER — Ambulatory Visit
Admission: RE | Admit: 2019-12-24 | Discharge: 2019-12-24 | Disposition: A | Discharge status: Home / Self Care | Payer: Self-pay | Source: Ambulatory Visit | Attending: Oncology | Admitting: Oncology

## 2019-12-24 DIAGNOSIS — C20 Malignant neoplasm of rectum: Secondary | ICD-10-CM

## 2019-12-24 NOTE — Progress Notes (Signed)
Called [facility] and requested they obtain [place] CT images and import to Epic.

## 2019-12-29 ENCOUNTER — Telehealth: Payer: Self-pay | Admitting: Oncology

## 2019-12-29 NOTE — Telephone Encounter (Signed)
I received a call from [place] from the referring office about an appt for the pt. Mr. Garica has been scheduled to see Dr. Benay Spice on 1/4 at 2pm. I explained that our office had a hard time reaching the pt. Tommy stated the pt was in the office and he will give him the appt info.

## 2019-12-30 ENCOUNTER — Telehealth: Payer: Self-pay | Admitting: *Deleted

## 2019-12-30 NOTE — Telephone Encounter (Signed)
Unable to find requested CT results in Epic.  Called [facility] and spoke with Tedra Coupe who will f/u order to obtain CT chest, abd & pelvis from [facility].

## 2020-01-04 ENCOUNTER — Inpatient Hospital Stay (HOSPITAL_BASED_OUTPATIENT_CLINIC_OR_DEPARTMENT_OTHER): Payer: Medicare Other | Admitting: Oncology

## 2020-01-04 ENCOUNTER — Telehealth: Payer: Self-pay | Admitting: *Deleted

## 2020-01-04 ENCOUNTER — Inpatient Hospital Stay: Payer: Medicare Other | Attending: Oncology

## 2020-01-04 ENCOUNTER — Other Ambulatory Visit: Payer: Self-pay

## 2020-01-04 VITALS — BP 131/82 | HR 73 | Temp 97.7°F | Resp 16 | Ht 67.0 in | Wt 160.1 lb

## 2020-01-04 DIAGNOSIS — K5900 Constipation, unspecified: Secondary | ICD-10-CM

## 2020-01-04 DIAGNOSIS — I10 Essential (primary) hypertension: Secondary | ICD-10-CM

## 2020-01-04 DIAGNOSIS — C20 Malignant neoplasm of rectum: Secondary | ICD-10-CM

## 2020-01-04 DIAGNOSIS — D649 Anemia, unspecified: Secondary | ICD-10-CM

## 2020-01-04 DIAGNOSIS — N400 Enlarged prostate without lower urinary tract symptoms: Secondary | ICD-10-CM | POA: Insufficient documentation

## 2020-01-04 DIAGNOSIS — M545 Low back pain: Secondary | ICD-10-CM

## 2020-01-04 DIAGNOSIS — F17210 Nicotine dependence, cigarettes, uncomplicated: Secondary | ICD-10-CM

## 2020-01-04 DIAGNOSIS — Z8619 Personal history of other infectious and parasitic diseases: Secondary | ICD-10-CM

## 2020-01-04 DIAGNOSIS — Z8673 Personal history of transient ischemic attack (TIA), and cerebral infarction without residual deficits: Secondary | ICD-10-CM

## 2020-01-04 DIAGNOSIS — E785 Hyperlipidemia, unspecified: Secondary | ICD-10-CM | POA: Insufficient documentation

## 2020-01-04 DIAGNOSIS — Z905 Acquired absence of kidney: Secondary | ICD-10-CM

## 2020-01-04 DIAGNOSIS — E039 Hypothyroidism, unspecified: Secondary | ICD-10-CM

## 2020-01-04 DIAGNOSIS — Z933 Colostomy status: Secondary | ICD-10-CM | POA: Insufficient documentation

## 2020-01-04 DIAGNOSIS — C19 Malignant neoplasm of rectosigmoid junction: Secondary | ICD-10-CM | POA: Diagnosis not present

## 2020-01-04 DIAGNOSIS — J449 Chronic obstructive pulmonary disease, unspecified: Secondary | ICD-10-CM | POA: Insufficient documentation

## 2020-01-04 LAB — CBC WITH DIFFERENTIAL (CANCER CENTER ONLY)
Abs Immature Granulocytes: 0.08 10*3/uL — ABNORMAL HIGH (ref 0.00–0.07)
Basophils Absolute: 0 10*3/uL (ref 0.0–0.1)
Basophils Relative: 0 %
Eosinophils Absolute: 0.2 10*3/uL (ref 0.0–0.5)
Eosinophils Relative: 1 %
HCT: 35 % — ABNORMAL LOW (ref 39.0–52.0)
Hemoglobin: 11 g/dL — ABNORMAL LOW (ref 13.0–17.0)
Immature Granulocytes: 1 %
Lymphocytes Relative: 19 %
Lymphs Abs: 2.6 10*3/uL (ref 0.7–4.0)
MCH: 25.5 pg — ABNORMAL LOW (ref 26.0–34.0)
MCHC: 31.4 g/dL (ref 30.0–36.0)
MCV: 81 fL (ref 80.0–100.0)
Monocytes Absolute: 1 10*3/uL (ref 0.1–1.0)
Monocytes Relative: 7 %
Neutro Abs: 9.8 10*3/uL — ABNORMAL HIGH (ref 1.7–7.7)
Neutrophils Relative %: 72 %
Platelet Count: 297 10*3/uL (ref 150–400)
RBC: 4.32 MIL/uL (ref 4.22–5.81)
RDW: 14.7 % (ref 11.5–15.5)
WBC Count: 13.7 10*3/uL — ABNORMAL HIGH (ref 4.0–10.5)
nRBC: 0 % (ref 0.0–0.2)

## 2020-01-04 LAB — CMP (CANCER CENTER ONLY)
ALT: 16 U/L (ref 0–44)
AST: 16 U/L (ref 15–41)
Albumin: 3.7 g/dL (ref 3.5–5.0)
Alkaline Phosphatase: 105 U/L (ref 38–126)
Anion gap: 10 (ref 5–15)
BUN: 40 mg/dL — ABNORMAL HIGH (ref 8–23)
CO2: 23 mmol/L (ref 22–32)
Calcium: 9.4 mg/dL (ref 8.9–10.3)
Chloride: 100 mmol/L (ref 98–111)
Creatinine: 1.88 mg/dL — ABNORMAL HIGH (ref 0.61–1.24)
GFR, Est AFR Am: 41 mL/min — ABNORMAL LOW (ref >60)
GFR, Estimated: 35 mL/min — ABNORMAL LOW (ref >60)
Glucose, Bld: 100 mg/dL — ABNORMAL HIGH (ref 70–99)
Potassium: 5.5 mmol/L — ABNORMAL HIGH (ref 3.5–5.1)
Sodium: 133 mmol/L — ABNORMAL LOW (ref 135–145)
Total Bilirubin: 0.1 mg/dL — ABNORMAL LOW (ref 0.3–1.2)
Total Protein: 8.2 g/dL — ABNORMAL HIGH (ref 6.5–8.1)

## 2020-01-04 MED ORDER — OXYCODONE-ACETAMINOPHEN 5-325 MG PO TABS
1.0000 | ORAL_TABLET | Freq: Once | ORAL | Status: AC
  Administered 2020-01-04: 15:00:00 1 via ORAL

## 2020-01-04 MED ORDER — OXYCODONE-ACETAMINOPHEN 5-325 MG PO TABS
ORAL_TABLET | ORAL | Status: AC
  Filled 2020-01-04: qty 1

## 2020-01-04 NOTE — Progress Notes (Signed)
@   1440: reports his back pain 8/10. Gave percocet #1 tablet (5/325). @ 1519: Reports back pain 6/10

## 2020-01-04 NOTE — Telephone Encounter (Signed)
K+ level noted at 5.5 today. Called facility and asked for [place]. Spoke with nurse Caryl Pina and provided her the K+ result and order for Kayexelate 30 grams po now. Have facility MD managed hyperkalemia going forward. Also faxed labs, office note and signed verbal order.
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
## 2020-01-05 ENCOUNTER — Ambulatory Visit: Payer: Self-pay | Admitting: General Surgery

## 2020-01-05 ENCOUNTER — Other Ambulatory Visit: Payer: Self-pay | Admitting: General Surgery

## 2020-01-05 ENCOUNTER — Inpatient Hospital Stay (HOSPITAL_COMMUNITY)
Admission: AD | Admit: 2020-01-05 | Discharge: 2020-01-14 | DRG: 330 | Disposition: A | Discharge status: Skilled Nursing Facility | Payer: Medicare Other | Attending: General Surgery | Admitting: General Surgery

## 2020-01-05 ENCOUNTER — Inpatient Hospital Stay (HOSPITAL_COMMUNITY): Payer: Medicare Other

## 2020-01-05 DIAGNOSIS — Z6824 Body mass index (BMI) 24.0-24.9, adult: Secondary | ICD-10-CM

## 2020-01-05 DIAGNOSIS — F17210 Nicotine dependence, cigarettes, uncomplicated: Secondary | ICD-10-CM | POA: Diagnosis present

## 2020-01-05 DIAGNOSIS — J449 Chronic obstructive pulmonary disease, unspecified: Secondary | ICD-10-CM | POA: Diagnosis present

## 2020-01-05 DIAGNOSIS — G4700 Insomnia, unspecified: Secondary | ICD-10-CM | POA: Diagnosis present

## 2020-01-05 DIAGNOSIS — Z79899 Other long term (current) drug therapy: Secondary | ICD-10-CM | POA: Diagnosis not present

## 2020-01-05 DIAGNOSIS — N19 Unspecified kidney failure: Secondary | ICD-10-CM | POA: Diagnosis not present

## 2020-01-05 DIAGNOSIS — C779 Secondary and unspecified malignant neoplasm of lymph node, unspecified: Secondary | ICD-10-CM | POA: Diagnosis present

## 2020-01-05 DIAGNOSIS — D649 Anemia, unspecified: Secondary | ICD-10-CM | POA: Diagnosis not present

## 2020-01-05 DIAGNOSIS — K219 Gastro-esophageal reflux disease without esophagitis: Secondary | ICD-10-CM | POA: Diagnosis present

## 2020-01-05 DIAGNOSIS — R05 Cough: Secondary | ICD-10-CM

## 2020-01-05 DIAGNOSIS — D630 Anemia in neoplastic disease: Secondary | ICD-10-CM | POA: Diagnosis present

## 2020-01-05 DIAGNOSIS — C19 Malignant neoplasm of rectosigmoid junction: Principal | ICD-10-CM | POA: Diagnosis present

## 2020-01-05 DIAGNOSIS — H9190 Unspecified hearing loss, unspecified ear: Secondary | ICD-10-CM | POA: Diagnosis present

## 2020-01-05 DIAGNOSIS — I129 Hypertensive chronic kidney disease with stage 1 through stage 4 chronic kidney disease, or unspecified chronic kidney disease: Secondary | ICD-10-CM | POA: Diagnosis present

## 2020-01-05 DIAGNOSIS — Z905 Acquired absence of kidney: Secondary | ICD-10-CM | POA: Diagnosis not present

## 2020-01-05 DIAGNOSIS — E785 Hyperlipidemia, unspecified: Secondary | ICD-10-CM | POA: Diagnosis present

## 2020-01-05 DIAGNOSIS — Z8673 Personal history of transient ischemic attack (TIA), and cerebral infarction without residual deficits: Secondary | ICD-10-CM

## 2020-01-05 DIAGNOSIS — N400 Enlarged prostate without lower urinary tract symptoms: Secondary | ICD-10-CM | POA: Diagnosis present

## 2020-01-05 DIAGNOSIS — Z7982 Long term (current) use of aspirin: Secondary | ICD-10-CM

## 2020-01-05 DIAGNOSIS — C189 Malignant neoplasm of colon, unspecified: Secondary | ICD-10-CM | POA: Diagnosis present

## 2020-01-05 DIAGNOSIS — E440 Moderate protein-calorie malnutrition: Secondary | ICD-10-CM | POA: Diagnosis present

## 2020-01-05 DIAGNOSIS — R059 Cough, unspecified: Secondary | ICD-10-CM

## 2020-01-05 DIAGNOSIS — N189 Chronic kidney disease, unspecified: Secondary | ICD-10-CM | POA: Diagnosis present

## 2020-01-05 DIAGNOSIS — B1920 Unspecified viral hepatitis C without hepatic coma: Secondary | ICD-10-CM | POA: Diagnosis present

## 2020-01-05 DIAGNOSIS — K5900 Constipation, unspecified: Secondary | ICD-10-CM | POA: Diagnosis present

## 2020-01-05 DIAGNOSIS — Z85528 Personal history of other malignant neoplasm of kidney: Secondary | ICD-10-CM | POA: Diagnosis not present

## 2020-01-05 DIAGNOSIS — K56609 Unspecified intestinal obstruction, unspecified as to partial versus complete obstruction: Secondary | ICD-10-CM | POA: Diagnosis not present

## 2020-01-05 DIAGNOSIS — Z20822 Contact with and (suspected) exposure to covid-19: Secondary | ICD-10-CM | POA: Diagnosis present

## 2020-01-05 DIAGNOSIS — C801 Malignant (primary) neoplasm, unspecified: Secondary | ICD-10-CM

## 2020-01-05 DIAGNOSIS — E039 Hypothyroidism, unspecified: Secondary | ICD-10-CM | POA: Diagnosis present

## 2020-01-05 DIAGNOSIS — C787 Secondary malignant neoplasm of liver and intrahepatic bile duct: Secondary | ICD-10-CM | POA: Diagnosis present

## 2020-01-05 DIAGNOSIS — Z79890 Hormone replacement therapy (postmenopausal): Secondary | ICD-10-CM

## 2020-01-05 DIAGNOSIS — Z452 Encounter for adjustment and management of vascular access device: Secondary | ICD-10-CM

## 2020-01-05 LAB — COMMENT2 - HEP PANEL

## 2020-01-05 LAB — CBC
HCT: 35.8 % — ABNORMAL LOW (ref 39.0–52.0)
Hemoglobin: 11.4 g/dL — ABNORMAL LOW (ref 13.0–17.0)
MCH: 26.4 pg (ref 26.0–34.0)
MCHC: 31.8 g/dL (ref 30.0–36.0)
MCV: 82.9 fL (ref 80.0–100.0)
Platelets: 243 K/uL (ref 150–400)
RBC: 4.32 MIL/uL (ref 4.22–5.81)
RDW: 15.6 % — ABNORMAL HIGH (ref 11.5–15.5)
WBC: 10.8 K/uL — ABNORMAL HIGH (ref 4.0–10.5)
nRBC: 0 % (ref 0.0–0.2)

## 2020-01-05 LAB — COMPREHENSIVE METABOLIC PANEL
ALT: 18 U/L (ref 0–44)
AST: 94 U/L — ABNORMAL HIGH (ref 15–41)
Albumin: 3.5 g/dL (ref 3.5–5.0)
Alkaline Phosphatase: 92 U/L (ref 38–126)
Anion gap: 12 (ref 5–15)
BUN: 36 mg/dL — ABNORMAL HIGH (ref 8–23)
CO2: 22 mmol/L (ref 22–32)
Calcium: 8.8 mg/dL — ABNORMAL LOW (ref 8.9–10.3)
Chloride: 96 mmol/L — ABNORMAL LOW (ref 98–111)
Creatinine, Ser: 1.98 mg/dL — ABNORMAL HIGH (ref 0.61–1.24)
GFR calc Af Amer: 39 mL/min — ABNORMAL LOW (ref >60)
GFR calc non Af Amer: 33 mL/min — ABNORMAL LOW (ref >60)
Glucose, Bld: 84 mg/dL (ref 70–99)
Potassium: >7.5 mmol/L (ref 3.5–5.1)
Sodium: 130 mmol/L — ABNORMAL LOW (ref 135–145)
Total Bilirubin: 0.7 mg/dL (ref 0.3–1.2)
Total Protein: 7.5 g/dL (ref 6.5–8.1)

## 2020-01-05 LAB — HEPATITIS C ANTIBODY (REFLEX): HCV Ab: >11 s/co ratio — ABNORMAL HIGH (ref 0.0–0.9)

## 2020-01-05 LAB — BASIC METABOLIC PANEL
Anion gap: 10 (ref 5–15)
BUN: 36 mg/dL — ABNORMAL HIGH (ref 8–23)
CO2: 26 mmol/L (ref 22–32)
Calcium: 9.1 mg/dL (ref 8.9–10.3)
Chloride: 99 mmol/L (ref 98–111)
Creatinine, Ser: 1.73 mg/dL — ABNORMAL HIGH (ref 0.61–1.24)
GFR calc Af Amer: 45 mL/min — ABNORMAL LOW (ref >60)
GFR calc non Af Amer: 39 mL/min — ABNORMAL LOW (ref >60)
Glucose, Bld: 93 mg/dL (ref 70–99)
Potassium: 4.3 mmol/L (ref 3.5–5.1)
Sodium: 135 mmol/L (ref 135–145)

## 2020-01-05 LAB — CEA (IN HOUSE-CHCC): CEA (CHCC-In House): 26.71 ng/mL — ABNORMAL HIGH (ref 0.00–5.00)

## 2020-01-05 LAB — HEMOGLOBIN A1C
Hgb A1c MFr Bld: 5.6 % (ref 4.8–5.6)
Mean Plasma Glucose: 114.02 mg/dL

## 2020-01-05 LAB — MAGNESIUM: Magnesium: 2.1 mg/dL (ref 1.7–2.4)

## 2020-01-05 LAB — PREALBUMIN: Prealbumin: 30.4 mg/dL (ref 18–38)

## 2020-01-05 LAB — TSH: TSH: 2.319 u[IU]/mL (ref 0.350–4.500)

## 2020-01-05 MED ORDER — MUPIROCIN 2 % EX OINT
1.0000 "application " | TOPICAL_OINTMENT | Freq: Two times a day (BID) | CUTANEOUS | Status: DC
  Administered 2020-01-06 – 2020-01-07 (×3): 1 via NASAL
  Filled 2020-01-05: qty 22

## 2020-01-05 MED ORDER — ONDANSETRON 4 MG PO TBDP
4.0000 mg | ORAL_TABLET | Freq: Four times a day (QID) | ORAL | Status: DC | PRN
  Filled 2020-01-05: qty 1

## 2020-01-05 MED ORDER — TAMSULOSIN HCL 0.4 MG PO CAPS: 0.4000 mg | ORAL_CAPSULE | Freq: Every day | ORAL | Status: DC

## 2020-01-05 MED ORDER — ALVIMOPAN 12 MG PO CAPS: 12.0000 mg | ORAL_CAPSULE | ORAL | Status: AC

## 2020-01-05 MED ORDER — CALCIUM CARBONATE ANTACID 500 MG PO CHEW: 750.0000 mg | CHEWABLE_TABLET | Freq: Two times a day (BID) | ORAL | Status: DC | PRN

## 2020-01-05 MED ORDER — ENOXAPARIN SODIUM 40 MG/0.4ML ~~LOC~~ SOLN: 40.0000 mg | Freq: Once | SUBCUTANEOUS | Status: DC

## 2020-01-05 MED ORDER — BUPIVACAINE LIPOSOME 1.3 % IJ SUSP
20.0000 mL | Freq: Once | INTRAMUSCULAR | Status: DC
  Filled 2020-01-05: qty 20

## 2020-01-05 MED ORDER — ALBUTEROL SULFATE HFA 108 (90 BASE) MCG/ACT IN AERS: 2.0000 | INHALATION_SPRAY | Freq: Four times a day (QID) | RESPIRATORY_TRACT | Status: DC | PRN

## 2020-01-05 MED ORDER — ONDANSETRON HCL 4 MG/2ML IJ SOLN
4.0000 mg | Freq: Four times a day (QID) | INTRAMUSCULAR | Status: DC | PRN
  Administered 2020-01-06 – 2020-01-07 (×5): 4 mg via INTRAVENOUS
  Filled 2020-01-05 (×5): qty 2

## 2020-01-05 MED ORDER — FLUTICASONE FUROATE-VILANTEROL 100-25 MCG/INH IN AEPB: 1.0000 | INHALATION_SPRAY | Freq: Every day | RESPIRATORY_TRACT | Status: DC

## 2020-01-05 MED ORDER — ACETAMINOPHEN 500 MG PO TABS: 1000.0000 mg | ORAL_TABLET | ORAL | Status: AC

## 2020-01-05 MED ORDER — TRAZODONE HCL 50 MG PO TABS
150.0000 mg | ORAL_TABLET | Freq: Every day | ORAL | Status: DC
  Administered 2020-01-05 – 2020-01-06 (×2): 150 mg via ORAL
  Filled 2020-01-05 (×2): qty 3

## 2020-01-05 MED ORDER — SODIUM CHLORIDE 0.9 % IV SOLN: 2.0000 g | INTRAVENOUS | Status: DC

## 2020-01-05 MED ORDER — POLYETHYLENE GLYCOL 3350 17 GM/SCOOP PO POWD
1.0000 | Freq: Once | ORAL | Status: AC
  Administered 2020-01-05: 238 g via ORAL
  Filled 2020-01-05: qty 255

## 2020-01-05 MED ORDER — ASPIRIN 81 MG PO CHEW
81.0000 mg | CHEWABLE_TABLET | Freq: Every day | ORAL | Status: DC
  Administered 2020-01-05 – 2020-01-07 (×3): 81 mg via ORAL
  Filled 2020-01-05 (×3): qty 1

## 2020-01-05 MED ORDER — CHLORHEXIDINE GLUCONATE CLOTH 2 % EX PADS
6.0000 | MEDICATED_PAD | Freq: Once | CUTANEOUS | Status: AC
  Administered 2020-01-05: 23:00:00 6 via TOPICAL

## 2020-01-05 MED ORDER — BISACODYL 10 MG RE SUPP
10.0000 mg | Freq: Once | RECTAL | Status: AC
  Administered 2020-01-05: 21:00:00 10 mg via RECTAL
  Filled 2020-01-05: qty 1

## 2020-01-05 MED ORDER — IPRATROPIUM-ALBUTEROL 0.5-2.5 (3) MG/3ML IN SOLN
3.0000 mL | Freq: Four times a day (QID) | RESPIRATORY_TRACT | Status: DC
  Administered 2020-01-05 – 2020-01-06 (×2): 3 mL via RESPIRATORY_TRACT
  Filled 2020-01-05 (×2): qty 3

## 2020-01-05 MED ORDER — DM-GUAIFENESIN ER 30-600 MG PO TB12: 2.0000 | ORAL_TABLET | Freq: Two times a day (BID) | ORAL | Status: DC | PRN

## 2020-01-05 MED ORDER — PANTOPRAZOLE SODIUM 40 MG PO TBEC
40.0000 mg | DELAYED_RELEASE_TABLET | Freq: Every day | ORAL | Status: DC
  Administered 2020-01-05 – 2020-01-07 (×3): 40 mg via ORAL
  Filled 2020-01-05 (×3): qty 1

## 2020-01-05 MED ORDER — ATORVASTATIN CALCIUM 10 MG PO TABS
10.0000 mg | ORAL_TABLET | Freq: Every day | ORAL | Status: DC
  Administered 2020-01-05 – 2020-01-07 (×3): 10 mg via ORAL
  Filled 2020-01-05 (×3): qty 1

## 2020-01-05 MED ORDER — AMLODIPINE BESYLATE 5 MG PO TABS
5.0000 mg | ORAL_TABLET | Freq: Every day | ORAL | Status: DC
  Administered 2020-01-05 – 2020-01-07 (×3): 5 mg via ORAL
  Filled 2020-01-05 (×3): qty 1

## 2020-01-05 MED ORDER — SODIUM CHLORIDE 0.9 % IV SOLN: INTRAVENOUS | Status: DC

## 2020-01-05 MED ORDER — LIP MEDEX EX OINT
TOPICAL_OINTMENT | CUTANEOUS | Status: AC
  Administered 2020-01-05: 1
  Filled 2020-01-05: qty 7

## 2020-01-05 MED ORDER — ENOXAPARIN SODIUM 30 MG/0.3ML ~~LOC~~ SOLN
30.0000 mg | SUBCUTANEOUS | Status: DC
  Administered 2020-01-05 – 2020-01-06 (×2): 30 mg via SUBCUTANEOUS
  Filled 2020-01-05 (×2): qty 0.3

## 2020-01-05 MED ORDER — CHLORHEXIDINE GLUCONATE CLOTH 2 % EX PADS
6.0000 | MEDICATED_PAD | Freq: Once | CUTANEOUS | Status: AC
  Administered 2020-01-06: 06:00:00 6 via TOPICAL

## 2020-01-05 MED ORDER — LEVOTHYROXINE SODIUM 50 MCG PO TABS
50.0000 ug | ORAL_TABLET | Freq: Every day | ORAL | Status: DC
  Administered 2020-01-06 – 2020-01-07 (×2): 50 ug via ORAL
  Filled 2020-01-05 (×2): qty 1

## 2020-01-05 MED ORDER — FINASTERIDE 5 MG PO TABS
5.0000 mg | ORAL_TABLET | Freq: Every day | ORAL | Status: DC
  Administered 2020-01-05 – 2020-01-07 (×3): 5 mg via ORAL
  Filled 2020-01-05 (×3): qty 1

## 2020-01-05 MED ORDER — HYDROMORPHONE HCL 1 MG/ML IJ SOLN
0.5000 mg | INTRAMUSCULAR | Status: DC | PRN
  Administered 2020-01-05 – 2020-01-07 (×13): 0.5 mg via INTRAVENOUS
  Filled 2020-01-05 (×13): qty 0.5

## 2020-01-05 NOTE — Progress Notes (Signed)
CRITICAL VALUE ALERT  Critical Value:  K+>7.7  Date & Time Notied: 01/05/20 at 15:12   Provider Notified: Earnstine Regal, PA  Orders Received/Actions taken: Awaiting Orders
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
## 2020-01-05 NOTE — H&P (Signed)
History of Present Illness Leighton Ruff MD; 123456 9:51 AM) The patient is a 69 year old male who presents with colorectal cancer. 69 year old male with an obstructing rectosigmoid colon cancer. He underwent CT scans of the chest, abdomen and pelvis. He has 2 lesions in his liver that are concerning for metastatic disease. He reports significant abdominal pain for the past month. He is having a lot of back pain and right lower quadrant pain currently. He denies any fevers. He is not passing any flatus. He is having a bowel movement every 3-4 days with the assistance of lactulose and MiraLAX. He has not had a CEA level.   Diagnostic Studies History Mammie Lorenzo, LPN; 624THL QA348G AM) Colonoscopy within last year  Allergies Mammie Lorenzo, LPN; 624THL QA348G AM) No Known Drug Allergies [01/05/2020]:  Medication History Mammie Lorenzo, LPN; 624THL QA348G AM) Proventil (90MCG/ACT Aerosol Soln, Inhalation) Active. Breo Ellipta (100-25MCG/INH Aero Pow Br Act, Inhalation) Active. Nitrostat (0.4MG  Tab Sublingual, Sublingual as needed) Active. Tums Chewy Bites (750MG  Tablet Chewable, Oral) Active. Dulcolax (1200MG /15ML Suspension, Oral) Active. Mucinex (600MG  Tablet ER 12HR, Oral) Active. Norco (5-325MG  Tablet, Oral) Active. Lunesta (1MG  Tablet, Oral) Active. Resource 2.0 (Oral) Active. ZyrTEC (10MG  Tablet, Oral) Active. Lipitor (10MG  Tablet, Oral) Active. Lactulose (10GM/15ML Solution, Oral) Active. Protonix (40MG  Packet, Oral) Active. Senokot (8.6MG  Tablet, Oral) Active. Synthroid (50MCG Tablet, Oral) Active. Norvasc (5MG  Tablet, Oral) Active. Aspir-Low (81MG  Tablet DR, Oral) Active. Feosol (325 (65 Fe)MG Tablet, Oral) Active. Flomax (0.4MG  Capsule, Oral) Active. MiraLax (17GM Packet, Oral) Active. Melatonin (3MG  Capsule, Oral) Active. Desyrel (50MG  Tablet, Oral) Active. Medications Reconciled  Social History Mammie Lorenzo, LPN; 624THL QA348G  AM) Alcohol use Remotely quit alcohol use. No caffeine use No drug use Tobacco use Current some day smoker.  Family History Mammie Lorenzo, LPN; 624THL QA348G AM) First Degree Relatives No pertinent family history  Other Problems Mammie Lorenzo, LPN; 624THL QA348G AM) High blood pressure     Review of Systems Claiborne Billings Dockery LPN; 624THL QA348G AM) General Present- Appetite Loss and Fatigue. Not Present- Chills, Fever, Night Sweats, Weight Gain and Weight Loss.  Vitals Claiborne Billings Dockery LPN; 624THL QA348G AM) 01/05/2020 9:20 AM Weight: 161.8 lb Height: 67in Body Surface Area: 1.85 m Body Mass Index: 25.34 kg/m  Temp.: 98.30F(Thermal Scan)  Pulse: 89 (Regular)  BP: 128/72 (Sitting, Left Arm, Standard)        Physical Exam Leighton Ruff MD; 123456 9:50 AM)  General Mental Status-Alert. General Appearance-Not in acute distress. Build & Nutrition-Well nourished. Posture-Normal posture. Gait-Normal.  Head and Neck Head-normocephalic, atraumatic with no lesions or palpable masses. Trachea-midline.  Chest and Lung Exam Chest and lung exam reveals -on auscultation, normal breath sounds, no adventitious sounds and normal vocal resonance.  Cardiovascular Cardiovascular examination reveals -normal heart sounds, regular rate and rhythm with no murmurs and no digital clubbing, cyanosis, edema, increased warmth or tenderness.  Abdomen Inspection Inspection of the abdomen reveals - No Hernias. Palpation/Percussion Palpation and Percussion of the abdomen reveal - Soft, Non Tender, No Rigidity (guarding), No hepatosplenomegaly and No Palpable abdominal masses.  Neurologic Neurologic evaluation reveals -alert and oriented x 3 with no impairment of recent or remote memory, normal attention span and ability to concentrate, normal sensation and normal coordination.  Musculoskeletal Normal Exam - Bilateral-Upper Extremity Strength Normal  and Lower Extremity Strength Normal.    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 I have discussed with Dr Harlow Asa at the [facility] and he will accept for direct admit and urgent surgery this week.

## 2020-01-06 ENCOUNTER — Encounter (HOSPITAL_COMMUNITY): Admission: AD | Disposition: A | Discharge status: Skilled Nursing Facility | Payer: Self-pay | Source: Home / Self Care

## 2020-01-06 ENCOUNTER — Inpatient Hospital Stay (HOSPITAL_COMMUNITY): Payer: Medicare Other

## 2020-01-06 LAB — BASIC METABOLIC PANEL
Anion gap: 8 (ref 5–15)
BUN: 31 mg/dL — ABNORMAL HIGH (ref 8–23)
CO2: 22 mmol/L (ref 22–32)
Calcium: 8.7 mg/dL — ABNORMAL LOW (ref 8.9–10.3)
Chloride: 103 mmol/L (ref 98–111)
Creatinine, Ser: 1.66 mg/dL — ABNORMAL HIGH (ref 0.61–1.24)
GFR calc Af Amer: 48 mL/min — ABNORMAL LOW (ref >60)
GFR calc non Af Amer: 41 mL/min — ABNORMAL LOW (ref >60)
Glucose, Bld: 99 mg/dL (ref 70–99)
Potassium: 4.6 mmol/L (ref 3.5–5.1)
Sodium: 133 mmol/L — ABNORMAL LOW (ref 135–145)

## 2020-01-06 LAB — CBC
HCT: 34.2 % — ABNORMAL LOW (ref 39.0–52.0)
Hemoglobin: 10.3 g/dL — ABNORMAL LOW (ref 13.0–17.0)
MCH: 25.6 pg — ABNORMAL LOW (ref 26.0–34.0)
MCHC: 30.1 g/dL (ref 30.0–36.0)
MCV: 84.9 fL (ref 80.0–100.0)
Platelets: 265 10*3/uL (ref 150–400)
RBC: 4.03 MIL/uL — ABNORMAL LOW (ref 4.22–5.81)
RDW: 14.9 % (ref 11.5–15.5)
WBC: 9.8 10*3/uL (ref 4.0–10.5)
nRBC: 0 % (ref 0.0–0.2)

## 2020-01-06 LAB — SURGICAL PCR SCREEN
MRSA, PCR: NEGATIVE
Staphylococcus aureus: NEGATIVE

## 2020-01-06 LAB — APTT: aPTT: 30 seconds (ref 24–36)

## 2020-01-06 LAB — PROTIME-INR
INR: 1 (ref 0.8–1.2)
Prothrombin Time: 13.3 seconds (ref 11.4–15.2)

## 2020-01-06 LAB — SARS CORONAVIRUS 2 (TAT 6-24 HRS): SARS Coronavirus 2: NEGATIVE

## 2020-01-06 SURGERY — COLON RESECTION
Anesthesia: General

## 2020-01-06 MED ORDER — CHLORHEXIDINE GLUCONATE CLOTH 2 % EX PADS
6.0000 | MEDICATED_PAD | Freq: Every day | CUTANEOUS | Status: DC
  Administered 2020-01-06: 6 via TOPICAL

## 2020-01-06 MED ORDER — IPRATROPIUM-ALBUTEROL 0.5-2.5 (3) MG/3ML IN SOLN
3.0000 mL | Freq: Three times a day (TID) | RESPIRATORY_TRACT | Status: DC
  Administered 2020-01-06 – 2020-01-07 (×3): 3 mL via RESPIRATORY_TRACT
  Filled 2020-01-06 (×3): qty 3

## 2020-01-06 MED ORDER — POLYETHYLENE GLYCOL 3350 17 G PO PACK
17.0000 g | PACK | Freq: Three times a day (TID) | ORAL | Status: AC
  Administered 2020-01-06 (×3): 17 g via ORAL
  Filled 2020-01-06 (×3): qty 1
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
## 2020-01-06 NOTE — Progress Notes (Signed)
Initial Nutrition Assessment  INTERVENTION:   -Will monitor for diet advancement following surgery  NUTRITION DIAGNOSIS:   Increased nutrient needs related to cancer and cancer related treatments, post-op healing as evidenced by estimated needs.  GOAL:   Patient will meet greater than or equal to 90% of their needs  MONITOR:   Diet advancement, Labs, Weight trends, I & O's  REASON FOR ASSESSMENT:   Consult Assessment of nutrition requirement/status  ASSESSMENT:   69 year old male with an obstructing rectosigmoid colon cancer.  He underwent CT scans of the chest, abdomen and pelvis.  He has 2 lesions in his liver that are concerning for metastatic disease.  He reports significant abdominal pain for the past month.  He is having a lot of back pain and right lower quadrant pain currently. Admitted for urgent surgery.  **RD working remotely**  Pt awaiting resection per surgery. Pt just recently diagnosed with colorectal cancer in December.  Pt has been having increased abdominal pain over the past few days. Pt currently on clear liquids to be changed to NPO prior to surgery. Following surgery and diet advancement, will order protein supplements to aid in healing and increased needs.  Per weight records, no weight loss noted.   I/Os: +1.3L since admit UOP: 300 ml x 24 hrs  Medications: IV Zofran PRN Labs reviewed: Low Na  NUTRITION - FOCUSED PHYSICAL EXAM:  Working remotely.  Diet Order:   Diet Order            Diet NPO time specified  Diet effective midnight        Diet clear liquid Room service appropriate? Yes; Fluid consistency: Thin  Diet effective now              EDUCATION NEEDS:   No education needs have been identified at this time  Skin:  Skin Assessment: Reviewed RN Assessment  Last BM:  1/6  Height:   Ht Readings from Last 1 Encounters:  01/04/20 5\' 7"  (1.702 m)    Weight:   Wt Readings from Last 1 Encounters:  01/04/20 72.6 kg     Ideal Body Weight:  67.2 kg  BMI: 25.1 kg/m^2  Estimated Nutritional Needs:   Kcal:  2200-2400  Protein:  95-105g  Fluid:  2.2L/day  Clayton Bibles, MS, RD, LDN Inpatient Clinical Dietitian Pager: [phone] After Hours Pager: [phone]
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
## 2020-01-06 NOTE — Consult Note (Signed)
.  Mut[facility] Nurse requested for preoperative stoma site marking  Discussed surgical procedure and stoma creation with patient. Explained role of the [place] nurse team.  Provided the patient with educational booklet and provided samples of pouching options.  Patient lives in group home, most likely will need CG support to assist with care of ostomy  Examined patient lying, sitting in order to place the marking in the patient's visual field, away from any creases or abdominal contour issues and within the rectus muscle.    Marked for colostomy in the LLQ  __5__ cm to the left of the umbilicus and 0000000 below the umbilicus.  Patient's abdomen cleansed with CHG wipes at site markings, allowed to air dry prior to marking.Covered mark with thin film transparent dressing to preserve mark until date of surgery.   [facility] Nurse team will follow up with patient after surgery for continue ostomy care and teaching.  [place] MSN, [place], [place], [place]

## 2020-01-07 ENCOUNTER — Encounter (HOSPITAL_COMMUNITY): Payer: Self-pay

## 2020-01-07 ENCOUNTER — Inpatient Hospital Stay (HOSPITAL_COMMUNITY): Payer: Medicare Other

## 2020-01-07 ENCOUNTER — Other Ambulatory Visit (HOSPITAL_COMMUNITY): Payer: Self-pay | Admitting: Radiology

## 2020-01-07 ENCOUNTER — Other Ambulatory Visit: Payer: Self-pay

## 2020-01-07 ENCOUNTER — Inpatient Hospital Stay (HOSPITAL_COMMUNITY): Payer: Medicare Other | Admitting: Certified Registered Nurse Anesthetist

## 2020-01-07 ENCOUNTER — Encounter (HOSPITAL_COMMUNITY): Admission: AD | Disposition: A | Discharge status: Skilled Nursing Facility | Payer: Self-pay | Source: Home / Self Care

## 2020-01-07 HISTORY — PX: LAPAROTOMY: SHX154

## 2020-01-07 LAB — HCV RNA QUANT

## 2020-01-07 LAB — HCV RNA (INTERNATIONAL UNITS)
HCV RNA (International Units): 16100000 IU/mL
HCV log10: 7.207 log10 IU/mL

## 2020-01-07 LAB — CREATININE, SERUM
Creatinine, Ser: 1.73 mg/dL — ABNORMAL HIGH (ref 0.61–1.24)
GFR calc Af Amer: 45 mL/min — ABNORMAL LOW (ref >60)
GFR calc non Af Amer: 39 mL/min — ABNORMAL LOW (ref >60)

## 2020-01-07 LAB — BASIC METABOLIC PANEL
Anion gap: 7 (ref 5–15)
BUN: 21 mg/dL (ref 8–23)
CO2: 21 mmol/L — ABNORMAL LOW (ref 22–32)
Calcium: 8.7 mg/dL — ABNORMAL LOW (ref 8.9–10.3)
Chloride: 105 mmol/L (ref 98–111)
Creatinine, Ser: 1.7 mg/dL — ABNORMAL HIGH (ref 0.61–1.24)
GFR calc Af Amer: 46 mL/min — ABNORMAL LOW (ref >60)
GFR calc non Af Amer: 40 mL/min — ABNORMAL LOW (ref >60)
Glucose, Bld: 109 mg/dL — ABNORMAL HIGH (ref 70–99)
Potassium: 4.5 mmol/L (ref 3.5–5.1)
Sodium: 133 mmol/L — ABNORMAL LOW (ref 135–145)

## 2020-01-07 LAB — CBC
HCT: 35 % — ABNORMAL LOW (ref 39.0–52.0)
HCT: 37.1 % — ABNORMAL LOW (ref 39.0–52.0)
Hemoglobin: 10.4 g/dL — ABNORMAL LOW (ref 13.0–17.0)
Hemoglobin: 11.2 g/dL — ABNORMAL LOW (ref 13.0–17.0)
MCH: 25.6 pg — ABNORMAL LOW (ref 26.0–34.0)
MCH: 26 pg (ref 26.0–34.0)
MCHC: 29.7 g/dL — ABNORMAL LOW (ref 30.0–36.0)
MCHC: 30.2 g/dL (ref 30.0–36.0)
MCV: 86.2 fL (ref 80.0–100.0)
MCV: 86.1 fL (ref 80.0–100.0)
Platelets: 249 10*3/uL (ref 150–400)
Platelets: 267 10*3/uL (ref 150–400)
RBC: 4.06 MIL/uL — ABNORMAL LOW (ref 4.22–5.81)
RBC: 4.31 MIL/uL (ref 4.22–5.81)
RDW: 14.9 % (ref 11.5–15.5)
RDW: 14.8 % (ref 11.5–15.5)
WBC: 8.9 10*3/uL (ref 4.0–10.5)
WBC: 20 10*3/uL — ABNORMAL HIGH (ref 4.0–10.5)
nRBC: 0 % (ref 0.0–0.2)
nRBC: 0 % (ref 0.0–0.2)

## 2020-01-07 SURGERY — LAPAROTOMY, EXPLORATORY
Anesthesia: General

## 2020-01-07 MED ORDER — DEXAMETHASONE SODIUM PHOSPHATE 10 MG/ML IJ SOLN
INTRAMUSCULAR | Status: DC | PRN
  Administered 2020-01-07: 10 mg via INTRAVENOUS

## 2020-01-07 MED ORDER — ACETAMINOPHEN 10 MG/ML IV SOLN: 1000.0000 mg | Freq: Once | INTRAVENOUS | Status: DC | PRN

## 2020-01-07 MED ORDER — CHLORHEXIDINE GLUCONATE CLOTH 2 % EX PADS
6.0000 | MEDICATED_PAD | Freq: Every day | CUTANEOUS | Status: DC
  Administered 2020-01-08 – 2020-01-14 (×6): 6 via TOPICAL

## 2020-01-07 MED ORDER — PHENYLEPHRINE 40 MCG/ML (10ML) SYRINGE FOR IV PUSH (FOR BLOOD PRESSURE SUPPORT)
PREFILLED_SYRINGE | INTRAVENOUS | Status: AC
  Filled 2020-01-07: qty 20

## 2020-01-07 MED ORDER — FENTANYL CITRATE (PF) 100 MCG/2ML IJ SOLN
25.0000 ug | INTRAMUSCULAR | Status: DC | PRN
  Administered 2020-01-07 (×3): 50 ug via INTRAVENOUS

## 2020-01-07 MED ORDER — FENTANYL CITRATE (PF) 250 MCG/5ML IJ SOLN
INTRAMUSCULAR | Status: AC
  Filled 2020-01-07: qty 5

## 2020-01-07 MED ORDER — ACETAMINOPHEN 325 MG PO TABS: 650.0000 mg | ORAL_TABLET | Freq: Four times a day (QID) | ORAL | Status: DC | PRN

## 2020-01-07 MED ORDER — HYDROMORPHONE HCL 1 MG/ML IJ SOLN
1.0000 mg | INTRAMUSCULAR | Status: DC | PRN
  Administered 2020-01-07: 0.5 mg via INTRAVENOUS
  Administered 2020-01-07: 20:00:00 1 mg via INTRAVENOUS
  Administered 2020-01-07: 2 mg via INTRAVENOUS
  Administered 2020-01-07: 17:00:00 1 mg via INTRAVENOUS
  Administered 2020-01-07: 0.5 mg via INTRAVENOUS
  Administered 2020-01-07 – 2020-01-08 (×3): 2 mg via INTRAVENOUS
  Filled 2020-01-07 (×3): qty 2
  Filled 2020-01-07 (×2): qty 1
  Filled 2020-01-07: qty 2

## 2020-01-07 MED ORDER — ONDANSETRON HCL 4 MG/2ML IJ SOLN
4.0000 mg | Freq: Four times a day (QID) | INTRAMUSCULAR | Status: DC | PRN
  Administered 2020-01-07 – 2020-01-08 (×3): 4 mg via INTRAVENOUS
  Filled 2020-01-07 (×3): qty 2

## 2020-01-07 MED ORDER — ALBUTEROL SULFATE HFA 108 (90 BASE) MCG/ACT IN AERS
INHALATION_SPRAY | RESPIRATORY_TRACT | Status: DC | PRN
  Administered 2020-01-07: 4 via RESPIRATORY_TRACT

## 2020-01-07 MED ORDER — ACETAMINOPHEN 160 MG/5ML PO SOLN: 325.0000 mg | Freq: Once | ORAL | Status: DC | PRN

## 2020-01-07 MED ORDER — LACTATED RINGERS IV SOLN: INTRAVENOUS | Status: DC

## 2020-01-07 MED ORDER — PROPOFOL 10 MG/ML IV BOLUS
INTRAVENOUS | Status: DC | PRN
  Administered 2020-01-07: 140 mg via INTRAVENOUS

## 2020-01-07 MED ORDER — PHENYLEPHRINE 40 MCG/ML (10ML) SYRINGE FOR IV PUSH (FOR BLOOD PRESSURE SUPPORT)
PREFILLED_SYRINGE | INTRAVENOUS | Status: DC | PRN
  Administered 2020-01-07: 80 ug via INTRAVENOUS

## 2020-01-07 MED ORDER — ONDANSETRON HCL 4 MG/2ML IJ SOLN
INTRAMUSCULAR | Status: DC | PRN
  Administered 2020-01-07: 4 mg via INTRAVENOUS

## 2020-01-07 MED ORDER — LIDOCAINE 2% (20 MG/ML) 5 ML SYRINGE
INTRAMUSCULAR | Status: DC | PRN
  Administered 2020-01-07: 60 mg via INTRAVENOUS

## 2020-01-07 MED ORDER — ALBUTEROL SULFATE (2.5 MG/3ML) 0.083% IN NEBU
2.5000 mg | INHALATION_SOLUTION | Freq: Once | RESPIRATORY_TRACT | Status: AC
  Administered 2020-01-07: 12:00:00 2.5 mg via RESPIRATORY_TRACT

## 2020-01-07 MED ORDER — ONDANSETRON HCL 4 MG PO TABS: 4.0000 mg | ORAL_TABLET | Freq: Four times a day (QID) | ORAL | Status: DC | PRN

## 2020-01-07 MED ORDER — SODIUM CHLORIDE 0.9 % IR SOLN
Status: DC | PRN
  Administered 2020-01-07 (×2): 1000 mL

## 2020-01-07 MED ORDER — MEPERIDINE HCL 50 MG/ML IJ SOLN: 6.2500 mg | INTRAMUSCULAR | Status: DC | PRN

## 2020-01-07 MED ORDER — PROMETHAZINE HCL 25 MG/ML IJ SOLN: 6.2500 mg | INTRAMUSCULAR | Status: DC | PRN

## 2020-01-07 MED ORDER — ENOXAPARIN SODIUM 40 MG/0.4ML ~~LOC~~ SOLN
40.0000 mg | SUBCUTANEOUS | Status: DC
  Administered 2020-01-08 – 2020-01-12 (×5): 40 mg via SUBCUTANEOUS
  Filled 2020-01-07 (×5): qty 0.4

## 2020-01-07 MED ORDER — ALBUTEROL SULFATE HFA 108 (90 BASE) MCG/ACT IN AERS
INHALATION_SPRAY | RESPIRATORY_TRACT | Status: AC
  Filled 2020-01-07: qty 6.7

## 2020-01-07 MED ORDER — HYDROMORPHONE HCL 1 MG/ML IJ SOLN
INTRAMUSCULAR | Status: AC
  Filled 2020-01-07: qty 1

## 2020-01-07 MED ORDER — ACETAMINOPHEN 325 MG PO TABS: 325.0000 mg | ORAL_TABLET | Freq: Once | ORAL | Status: DC | PRN

## 2020-01-07 MED ORDER — KCL IN DEXTROSE-NACL 20-5-0.45 MEQ/L-%-% IV SOLN
INTRAVENOUS | Status: DC
  Filled 2020-01-07 (×2): qty 1000

## 2020-01-07 MED ORDER — SUGAMMADEX SODIUM 200 MG/2ML IV SOLN
INTRAVENOUS | Status: DC | PRN
  Administered 2020-01-07: 200 mg via INTRAVENOUS

## 2020-01-07 MED ORDER — ROCURONIUM BROMIDE 10 MG/ML (PF) SYRINGE
PREFILLED_SYRINGE | INTRAVENOUS | Status: DC | PRN
  Administered 2020-01-07: 10 mg via INTRAVENOUS
  Administered 2020-01-07: 50 mg via INTRAVENOUS
  Administered 2020-01-07: 20 mg via INTRAVENOUS

## 2020-01-07 MED ORDER — PROPOFOL 10 MG/ML IV BOLUS
INTRAVENOUS | Status: AC
  Filled 2020-01-07: qty 20

## 2020-01-07 MED ORDER — FENTANYL CITRATE (PF) 100 MCG/2ML IJ SOLN
INTRAMUSCULAR | Status: DC | PRN
  Administered 2020-01-07: 100 ug via INTRAVENOUS
  Administered 2020-01-07: 50 ug via INTRAVENOUS
  Administered 2020-01-07: 100 ug via INTRAVENOUS

## 2020-01-07 MED ORDER — FENTANYL CITRATE (PF) 100 MCG/2ML IJ SOLN
INTRAMUSCULAR | Status: AC
  Filled 2020-01-07: qty 2

## 2020-01-07 MED ORDER — ALBUTEROL SULFATE (2.5 MG/3ML) 0.083% IN NEBU
INHALATION_SOLUTION | RESPIRATORY_TRACT | Status: AC
  Filled 2020-01-07: qty 3

## 2020-01-07 MED ORDER — SODIUM CHLORIDE 0.9 % IV SOLN
2.0000 g | INTRAVENOUS | Status: AC
  Administered 2020-01-07: 2 g via INTRAVENOUS
  Filled 2020-01-07 (×2): qty 2

## 2020-01-07 MED ORDER — SUCCINYLCHOLINE CHLORIDE 20 MG/ML IJ SOLN
INTRAMUSCULAR | Status: DC | PRN
  Administered 2020-01-07: 120 mg via INTRAVENOUS

## 2020-01-07 SURGICAL SUPPLY — 45 items
APPLICATOR COTTON TIP 6 STRL (MISCELLANEOUS) IMPLANT
APPLICATOR COTTON TIP 6IN STRL (MISCELLANEOUS)
BLADE EXTENDED COATED 6.5IN (ELECTRODE) IMPLANT
BLADE HEX COATED 2.75 (ELECTRODE) IMPLANT
CHLORAPREP W/TINT 26 (MISCELLANEOUS) ×3 IMPLANT
COVER MAYO STAND STRL (DRAPES) IMPLANT
COVER SURGICAL LIGHT HANDLE (MISCELLANEOUS) ×3 IMPLANT
COVER WAND RF STERILE (DRAPES) IMPLANT
DRAPE LAPAROSCOPIC ABDOMINAL (DRAPES) ×3 IMPLANT
DRAPE WARM FLUID 44X44 (DRAPES) ×3 IMPLANT
ELECT BLADE TIP CTD 4 INCH (ELECTRODE) ×3 IMPLANT
ELECT REM PT RETURN 15FT ADLT (MISCELLANEOUS) ×3 IMPLANT
GAUZE SPONGE 4X4 12PLY STRL (GAUZE/BANDAGES/DRESSINGS) ×3 IMPLANT
GLOVE BIOGEL PI IND STRL 7.0 (GLOVE) ×1 IMPLANT
GLOVE BIOGEL PI INDICATOR 7.0 (GLOVE) ×2
GLOVE SURG ORTHO 8.0 STRL STRW (GLOVE) ×3 IMPLANT
GOWN STRL REUS W/TWL LRG LVL3 (GOWN DISPOSABLE) ×3 IMPLANT
GOWN STRL REUS W/TWL XL LVL3 (GOWN DISPOSABLE) ×6 IMPLANT
HANDLE SUCTION POOLE (INSTRUMENTS) ×1 IMPLANT
KIT BASIN OR (CUSTOM PROCEDURE TRAY) ×3 IMPLANT
KIT TURNOVER KIT A (KITS) IMPLANT
LIGASURE IMPACT 36 18CM CVD LR (INSTRUMENTS) ×3 IMPLANT
NS IRRIG 1000ML POUR BTL (IV SOLUTION) ×9 IMPLANT
PACK GENERAL/GYN (CUSTOM PROCEDURE TRAY) ×3 IMPLANT
PENCIL SMOKE EVACUATOR (MISCELLANEOUS) ×3 IMPLANT
SLEEVE SUCTION 125 (MISCELLANEOUS) ×3 IMPLANT
SPONGE LAP 18X18 RF (DISPOSABLE) IMPLANT
STAPLER CUT CVD 40MM BLUE (STAPLE) ×3 IMPLANT
STAPLER PROXIMATE 75MM BLUE (STAPLE) ×3 IMPLANT
STAPLER VISISTAT 35W (STAPLE) IMPLANT
SUCTION POOLE HANDLE (INSTRUMENTS) ×3
SUT NOV 1 T60/GS (SUTURE) ×18 IMPLANT
SUT SILK 2 0 (SUTURE)
SUT SILK 2 0 SH CR/8 (SUTURE) IMPLANT
SUT SILK 2-0 18XBRD TIE 12 (SUTURE) IMPLANT
SUT SILK 3 0 (SUTURE)
SUT SILK 3 0 SH CR/8 (SUTURE) ×6 IMPLANT
SUT SILK 3-0 18XBRD TIE 12 (SUTURE) IMPLANT
SUT VIC AB 3-0 SH 18 (SUTURE) ×6 IMPLANT
SUT VICRYL 2 0 18  UND BR (SUTURE)
SUT VICRYL 2 0 18 UND BR (SUTURE) IMPLANT
TOWEL OR 17X26 10 PK STRL BLUE (TOWEL DISPOSABLE) ×3 IMPLANT
TRAY FOLEY MTR SLVR 16FR STAT (SET/KITS/TRAYS/PACK) ×3 IMPLANT
WATER STERILE IRR 1000ML POUR (IV SOLUTION) IMPLANT
YANKAUER SUCT BULB TIP NO VENT (SUCTIONS) IMPLANT

## 2020-01-07 NOTE — Interval H&P Note (Signed)
History and Physical Interval Note:  01/07/2020 1:06 PM  Luke Wiggins  has presented today for surgery, with the diagnosis of smal bowel obstruction.  The various methods of treatment have been discussed with the patient and family. After consideration of risks, benefits and other options for treatment, the patient has consented to    Procedure(s): EXPLORATORY LAPAROTOMY, PARTIAL COLECTOMY,COLOSTOMY, AND LIVER BIOPSY (N/A) as a surgical intervention.    The patient's history has been reviewed, patient examined, no change in status, stable for surgery.  I have reviewed the patient's chart and labs.  Questions were answered to the patient's satisfaction.    [name], MD [facility] Office: [place]
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
## 2020-01-07 NOTE — Anesthesia Postprocedure Evaluation (Signed)
Anesthesia Post Note  Patient: Luke Wiggins  Procedure(s) Performed: EXPLORATORY LAPAROTOMY, PARTIAL COLECTOMY,COLOSTOMY, (N/A )     Patient location during evaluation: PACU Anesthesia Type: General Level of consciousness: awake and alert Pain management: pain level controlled Vital Signs Assessment: post-procedure vital signs reviewed and stable Respiratory status: spontaneous breathing, nonlabored ventilation, respiratory function stable and patient connected to nasal cannula oxygen Cardiovascular status: blood pressure returned to baseline and stable Postop Assessment: no apparent nausea or vomiting Anesthetic complications: no    Last Vitals:  Vitals:   01/07/20 1655 01/07/20 1655  BP: (!) 151/99   Pulse: 96 95  Resp:    Temp:  37.1 C  SpO2: 94% 94%    Last Pain:  Vitals:   01/07/20 1655  TempSrc: Oral  PainSc:                  Effie Berkshire

## 2020-01-07 NOTE — Anesthesia Preprocedure Evaluation (Addendum)
Anesthesia Evaluation  Patient identified by MRN, date of birth, ID band Patient awake    Reviewed: Allergy & Precautions, NPO status , Patient's Chart, lab work & pertinent test results  Airway Mallampati: I  TM Distance: >3 FB Neck ROM: Full    Dental  (+) Edentulous Upper, Edentulous Lower   Pulmonary COPD, Current Smoker,     + decreased breath sounds+ wheezing      Cardiovascular hypertension,  Rhythm:Regular Rate:Normal     Neuro/Psych PSYCHIATRIC DISORDERS Anxiety Depression    GI/Hepatic GERD  ,(+) Hepatitis -, C  Endo/Other  Hypothyroidism   Renal/GU      Musculoskeletal negative musculoskeletal ROS (+)   Abdominal Normal abdominal exam  (+)   Peds  Hematology negative hematology ROS (+)   Anesthesia Other Findings   Reproductive/Obstetrics                            Anesthesia Physical Anesthesia Plan  ASA: III and emergent  Anesthesia Plan: General   Post-op Pain Management:    Induction: Intravenous  PONV Risk Score and Plan: 2 and Ondansetron, Dexamethasone and Treatment may vary due to age or medical condition  Airway Management Planned: Oral ETT  Additional Equipment: None  Intra-op Plan:   Post-operative Plan: Possible Post-op intubation/ventilation  Informed Consent: I have reviewed the patients History and Physical, chart, labs and discussed the procedure including the risks, benefits and alternatives for the proposed anesthesia with the patient or authorized representative who has indicated his/her understanding and acceptance.       Plan Discussed with: CRNA  Anesthesia Plan Comments: (Breathing tx in preop. )       Anesthesia Quick Evaluation

## 2020-01-07 NOTE — Op Note (Signed)
Operative Note  Pre-operative Diagnosis:  Adenocarcinoma of the rectosigmoid colon  Post-operative Diagnosis:  same  Surgeon:  [name], MD  Assistant:  Will Creig Hines, PA-C   Procedure:  Low anterior resection of rectosigmoid colon with descending colostomy  Anesthesia:  general  Estimated Blood Loss:  100 cc  Drains: none         Specimen: to pathology  Indications: Patient is a 69 year old male admitted to the surgical service with a newly diagnosed near obstructing adenocarcinoma at the rectosigmoid junction.  Patient had undergone outside CT scan as well as a colonoscopy with biopsy.  Colonoscopy demonstrated a circumferential lesion which was near obstructing and the colonoscope was unable to traverse the tumor.  Biopsies were positive for adenocarcinoma.  Patient now comes to surgery for resection having failed an attempt at gentle preoperative bowel preparation.  Procedure:  The patient was seen in the pre-op holding area. The risks, benefits, complications, treatment options, and expected outcomes were previously discussed with the patient. The patient agreed with the proposed plan and has signed the informed consent form.  The patient was brought to the operating room by the surgical team, identified as Luke Wiggins and the procedure verified. A "time out" was completed and the above information confirmed.  Following administration of general anesthesia, the patient is prepped and draped in the usual aseptic fashion.  After ascertaining that an adequate level of anesthesia been achieved, the previous midline abdominal incision is reopened with a #10 blade.  Dissection is carried through subcutaneous tissues.  Fascia is incised in the midline and the peritoneal cavity is entered cautiously.  There are a few stainless steel sutures remaining in the fascia which are removed.  Adhesions are lysed bilaterally between the omentum and the anterior abdominal wall.  Exploration of the  abdomen shows a large tumor in the distal sigmoid and proximal rectum consistent with preoperative studies.  This tumor appears to be locally extensive with disease into the mesentery and adhesion to the posterior wall of the bladder.  Palpation of the liver reveals smooth rounded masses in the right lobe with the largest mass being approximately 3 cm at the dome of the liver.  These are not visualized due to the incision being relatively low on the anterior abdominal wall and not wishing to extend the incision all the way to the sternum.  There does not appear to be any omental disease.  There does not appear to be any peritoneal implants.  Balfour retractors placed for exposure.  Sigmoid colon is mobilized from its lateral peritoneal attachments.  A point in the proximal sigmoid colon is selected and transected with a GIA stapler.  Sigmoid mesentery is dissected out and divided with the LigaSure.  Dissection is carried distally to the proximal rectum.  Posterior to the tumor are masses within the mesorectum consistent with probable tumor filled lymph nodes.  Dissection is carried posterior to these lymph nodes by incising the peritoneum bilaterally and then dividing with the LigaSure.  Larger vessels are suture-ligated with 2-0 and 3-0 silk suture ligatures.  Posterior wall of the proximal rectum is cleared with gentle dissection with the right angle and vascular structures divided with the LigaSure.  A point approximately 10 cm distal to the tumor is selected and then transected with a contour stapler.  Specimen is brought out of the abdominal cavity.  Distal margin is marked with a suture and it is submitted in its entirety to pathology for review.  Mesentery of the  proximal sigmoid colon is incised with the LigaSure allowing for mobilization of the bowel to the left anterior abdominal wall.  An elliptical incision is made on the mid left abdominal wall at the site previously marked by the wound ostomy care  nurses.  A plug of adipose tissue was excised.  A cruciate incision is made into the fascia and into the peritoneal cavity.  Using a Babcock clamp, the proximal sigmoid end is easily delivered through the abdominal wall.  Bowel was secured to the fascia with interrupted 3-0 silk sutures.  Abdomen is irrigated copiously with warm saline.  The ends of the staple line on the rectum are marked with 2-0 Prolene sutures.  Good hemostasis is noted.  Midline incision is closed with interrupted #1 Novafil simple sutures.  Subcutaneous tissues are irrigated and then packed with Betadine soaked 4 x 4 gauze sponges and covered with dry gauze sponges and ABD pads.  Colostomy is matured to the skin edges circumferentially with interrupted 3-0 Vicryl sutures.  Colostomy appliance is applied.  Patient is awakened from anesthesia and brought to the recovery room.  The patient tolerated the procedure well.   [name], MD [facility] Office: [phone]

## 2020-01-07 NOTE — Transfer of Care (Signed)
Immediate Anesthesia Transfer of Care Note  Patient: Luke Wiggins  Procedure(s) Performed: EXPLORATORY LAPAROTOMY, PARTIAL COLECTOMY,COLOSTOMY, (N/A )  Patient Location: PACU  Anesthesia Type:General  Level of Consciousness: awake and responds to stimulation  Airway & Oxygen Therapy: Patient Spontanous Breathing and Patient connected to face mask oxygen  Post-op Assessment: Report given to RN and Post -op Vital signs reviewed and stable  Post vital signs: Reviewed and stable  Last Vitals:  Vitals Value Taken Time  BP 154/79 01/07/20 1538  Temp    Pulse 81 01/07/20 1540  Resp 24 01/07/20 1540  SpO2 91 % 01/07/20 1540  Vitals shown include unvalidated device data.  Last Pain:  Vitals:   01/07/20 1141  TempSrc:   PainSc: 7       Patients Stated Pain Goal: 2 ([id number])  Complications: No apparent anesthesia complications

## 2020-01-07 NOTE — Progress Notes (Signed)
Patient had several BM's after last laxitive given,  Stool was loose but not clear soup consistency. He's had CHG bath on evening.
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
## 2020-01-07 NOTE — Anesthesia Procedure Notes (Signed)
Procedure Name: Intubation Performed by: Gean Maidens, CRNA Pre-anesthesia Checklist: Patient identified, Emergency Drugs available, Suction available, Patient being monitored and Timeout performed Patient Re-evaluated:Patient Re-evaluated prior to induction Oxygen Delivery Method: Circle system utilized Preoxygenation: Pre-oxygenation with 100% oxygen Induction Type: IV induction Ventilation: Mask ventilation without difficulty Laryngoscope Size: Mac and 4 Grade View: Grade I Tube type: Oral Tube size: 7.5 mm Number of attempts: 1 Airway Equipment and Method: Stylet Placement Confirmation: ETT inserted through vocal cords under direct vision,  positive ETCO2 and breath sounds checked- equal and bilateral Secured at: 23 cm Tube secured with: Tape Dental Injury: Teeth and Oropharynx as per pre-operative assessment

## 2020-01-08 LAB — EXPECTORATED SPUTUM ASSESSMENT W GRAM STAIN, RFLX TO RESP C: Special Requests: NORMAL

## 2020-01-08 LAB — CBC
HCT: 34.1 % — ABNORMAL LOW (ref 39.0–52.0)
Hemoglobin: 10.5 g/dL — ABNORMAL LOW (ref 13.0–17.0)
MCH: 26.3 pg (ref 26.0–34.0)
MCHC: 30.8 g/dL (ref 30.0–36.0)
MCV: 85.3 fL (ref 80.0–100.0)
Platelets: 268 10*3/uL (ref 150–400)
RBC: 4 MIL/uL — ABNORMAL LOW (ref 4.22–5.81)
RDW: 14.9 % (ref 11.5–15.5)
WBC: 15.7 10*3/uL — ABNORMAL HIGH (ref 4.0–10.5)
nRBC: 0 % (ref 0.0–0.2)

## 2020-01-08 MED ORDER — SODIUM CHLORIDE 0.9% FLUSH: 9.0000 mL | INTRAVENOUS | Status: DC | PRN

## 2020-01-08 MED ORDER — BOOST / RESOURCE BREEZE PO LIQD CUSTOM
1.0000 | Freq: Three times a day (TID) | ORAL | Status: DC
  Administered 2020-01-08 – 2020-01-12 (×8): 1 via ORAL

## 2020-01-08 MED ORDER — KCL IN DEXTROSE-NACL 20-5-0.45 MEQ/L-%-% IV SOLN
INTRAVENOUS | Status: DC
  Filled 2020-01-08 (×6): qty 1000

## 2020-01-08 MED ORDER — DIPHENHYDRAMINE HCL 12.5 MG/5ML PO ELIX: 12.5000 mg | ORAL_SOLUTION | Freq: Four times a day (QID) | ORAL | Status: DC | PRN

## 2020-01-08 MED ORDER — MUPIROCIN 2 % EX OINT
TOPICAL_OINTMENT | Freq: Two times a day (BID) | CUTANEOUS | Status: DC
  Administered 2020-01-08 – 2020-01-12 (×5): 1 via NASAL
  Filled 2020-01-08: qty 22

## 2020-01-08 MED ORDER — ALBUTEROL SULFATE (2.5 MG/3ML) 0.083% IN NEBU: 2.5000 mg | INHALATION_SOLUTION | Freq: Four times a day (QID) | RESPIRATORY_TRACT | Status: DC | PRN

## 2020-01-08 MED ORDER — FINASTERIDE 5 MG PO TABS
5.0000 mg | ORAL_TABLET | Freq: Every day | ORAL | Status: DC
  Administered 2020-01-08 – 2020-01-14 (×5): 5 mg via ORAL
  Filled 2020-01-08 (×6): qty 1

## 2020-01-08 MED ORDER — MORPHINE SULFATE (PF) 2 MG/ML IV SOLN
2.0000 mg | INTRAVENOUS | Status: DC | PRN
  Administered 2020-01-08 – 2020-01-11 (×18): 2 mg via INTRAVENOUS
  Filled 2020-01-08 (×19): qty 1

## 2020-01-08 MED ORDER — TRAZODONE HCL 50 MG PO TABS
150.0000 mg | ORAL_TABLET | Freq: Every day | ORAL | Status: DC
  Administered 2020-01-08 – 2020-01-13 (×6): 150 mg via ORAL
  Filled 2020-01-08 (×6): qty 3

## 2020-01-08 MED ORDER — OXYCODONE HCL 5 MG PO TABS
5.0000 mg | ORAL_TABLET | ORAL | Status: DC | PRN
  Administered 2020-01-08 – 2020-01-14 (×20): 10 mg via ORAL
  Filled 2020-01-08 (×21): qty 2

## 2020-01-08 MED ORDER — ACETAMINOPHEN 325 MG PO TABS
650.0000 mg | ORAL_TABLET | Freq: Four times a day (QID) | ORAL | Status: DC
  Administered 2020-01-08 – 2020-01-11 (×8): 650 mg via ORAL
  Filled 2020-01-08 (×9): qty 2

## 2020-01-08 MED ORDER — AMLODIPINE BESYLATE 5 MG PO TABS
5.0000 mg | ORAL_TABLET | Freq: Every day | ORAL | Status: DC
  Administered 2020-01-08 – 2020-01-14 (×5): 5 mg via ORAL
  Filled 2020-01-08 (×6): qty 1

## 2020-01-08 MED ORDER — HYDROMORPHONE 1 MG/ML IV SOLN
INTRAVENOUS | Status: DC
  Administered 2020-01-08: 30 mg via INTRAVENOUS
  Filled 2020-01-08: qty 30

## 2020-01-08 MED ORDER — PANTOPRAZOLE SODIUM 40 MG IV SOLR
40.0000 mg | INTRAVENOUS | Status: DC
  Administered 2020-01-08 – 2020-01-10 (×3): 40 mg via INTRAVENOUS
  Filled 2020-01-08 (×3): qty 40

## 2020-01-08 MED ORDER — ONDANSETRON HCL 4 MG/2ML IJ SOLN: 4.0000 mg | Freq: Four times a day (QID) | INTRAMUSCULAR | Status: DC | PRN

## 2020-01-08 MED ORDER — IPRATROPIUM-ALBUTEROL 0.5-2.5 (3) MG/3ML IN SOLN
3.0000 mL | Freq: Three times a day (TID) | RESPIRATORY_TRACT | Status: DC
  Administered 2020-01-09 – 2020-01-11 (×7): 3 mL via RESPIRATORY_TRACT
  Filled 2020-01-08 (×6): qty 3

## 2020-01-08 MED ORDER — IPRATROPIUM-ALBUTEROL 0.5-2.5 (3) MG/3ML IN SOLN
3.0000 mL | Freq: Four times a day (QID) | RESPIRATORY_TRACT | Status: DC
  Administered 2020-01-08 (×2): 3 mL via RESPIRATORY_TRACT
  Filled 2020-01-08: qty 3

## 2020-01-08 MED ORDER — NALOXONE HCL 0.4 MG/ML IJ SOLN: 0.4000 mg | INTRAMUSCULAR | Status: DC | PRN

## 2020-01-08 MED ORDER — DIPHENHYDRAMINE HCL 50 MG/ML IJ SOLN: 12.5000 mg | Freq: Four times a day (QID) | INTRAMUSCULAR | Status: DC | PRN

## 2020-01-08 MED ORDER — LEVOTHYROXINE SODIUM 50 MCG PO TABS
50.0000 ug | ORAL_TABLET | Freq: Every day | ORAL | Status: DC
  Administered 2020-01-08 – 2020-01-14 (×7): 50 ug via ORAL
  Filled 2020-01-08: qty 2
  Filled 2020-01-08: qty 1
  Filled 2020-01-08: qty 2
  Filled 2020-01-08 (×5): qty 1

## 2020-01-08 NOTE — Consult Note (Signed)
Central Nurse ostomy follow up Stoma type/location:  Pt had colostomy surgery performed yesterday.  Stomal assessment/size: Stoma is red and viable, above skin level, 2 1/4 inches Peristomal assessment: intact skin surrounding Output: small amt liquid brown stool  Ostomy pouching: 1pc. Education provided: Current pouch was leaking behind the barrier.  Applied barrier ring to attempt to maintain a seal, then one piece pouch.  Pt watched the process using a hand held mirror but does not appear to understand. He will need total assistance with pouch application and emptying if he is unable to process related to cognitive function.   Enrolled patient in [place] Start Discharge program: Yes [place] team will continue to follow for another teaching session on Mon while in the hospital.  Extra supplies ordered to the bedside and educational materials left in the room.  There are no family members or caregivers present.  Julien Girt MSN, RN, [place], [place], [place]

## 2020-01-08 NOTE — Care Management Important Message (Signed)
Important Message  Patient Details IM Letter given to Marney Doctor RN Case Manager to present to the Patient Name: Luke Wiggins MRN: [id number] Date of Birth: 03-17-1951   Medicare Important Message Given:  Yes     Kerin Salen 01/08/2020, 10:36 AM

## 2020-01-08 NOTE — Progress Notes (Signed)
Assessment & Plan: POD#1 - status post ex lap with low anterior resection of obstructing adenocarcinoma at rectosigmoid junction, descending colostomy  NPO, sips, IVF - no NG post op  Encourage pulmonary toilet, OOB to chair today, Yankauer suction to bedside  Monitor creatinine - increase IVF rate this AM  Wound care to begin 1/9  Complains of pain - will order PCA - discussed with nurse        [name], MD       [facility]       Office: [phone]   Chief Complaint: Obstructing colon cancer  Subjective: Patient in bed, coughing up thick secretions  Objective: Vital signs in last 24 hours: Temp:  [97.7 F (36.5 C)-99.5 F (37.5 C)] 97.9 F (36.6 C) (01/08 0646) Pulse Rate:  [68-103] 91 (01/08 0646) Resp:  [14-18] 17 (01/08 0646) BP: (139-187)/(74-111) 153/74 (01/08 0646) SpO2:  [81 %-98 %] 81 % (01/08 0646) Weight:  [72.6 kg-73.5 kg] 73.5 kg (01/08 0500) Last BM Date: 01/07/20  Intake/Output from previous day: 01/07 0701 - 01/08 0700 In: 1600 [I.V.:1500; IV Piggyback:100] Out: 2576 [Urine:2525; Stool:1; Blood:50] Intake/Output this shift: No intake/output data recorded.  Physical Exam: HEENT - sclerae clear, mucous membranes moist Neck - soft Chest - coarse bilaterally Cor - RRR Abdomen - soft, mild distension; midline dressing dry and intact; ostomy bag intact, stoma edematous Ext - no edema, non-tender  Lab Results:  Recent Labs    01/07/20 1717 01/08/20 0443  WBC 20.0* 15.7*  HGB 11.2* 10.5*  HCT 37.1* 34.1*  PLT 267 268   BMET Recent Labs    01/06/20 0505 01/07/20 0545 01/07/20 1717  NA 133* 133*  --   K 4.6 4.5  --   CL 103 105  --   CO2 22 21*  --   GLUCOSE 99 109*  --   BUN 31* 21  --   CREATININE 1.66* 1.70* 1.73*  CALCIUM 8.7* 8.7*  --    PT/INR Recent Labs    01/06/20 0505  LABPROT 13.3  INR 1.0   Comprehensive Metabolic Panel:    Component Value Date/Time   NA 133 (L) 01/07/2020 0545   NA  133 (L) 01/06/2020 0505   K 4.5 01/07/2020 0545   K 4.6 01/06/2020 0505   CL 105 01/07/2020 0545   CL 103 01/06/2020 0505   CO2 21 (L) 01/07/2020 0545   CO2 22 01/06/2020 0505   BUN 21 01/07/2020 0545   BUN 31 (H) 01/06/2020 0505   CREATININE 1.73 (H) 01/07/2020 1717   CREATININE 1.70 (H) 01/07/2020 0545   CREATININE 1.88 (H) 01/04/2020 1543   GLUCOSE 109 (H) 01/07/2020 0545   GLUCOSE 99 01/06/2020 0505   CALCIUM 8.7 (L) 01/07/2020 0545   CALCIUM 8.7 (L) 01/06/2020 0505   AST 94 (H) 01/05/2020 1405   AST 16 01/04/2020 1543   AST 25 10/22/2016 1408   ALT 18 01/05/2020 1405   ALT 16 01/04/2020 1543   ALT 26 10/22/2016 1408   ALKPHOS 92 01/05/2020 1405   ALKPHOS 105 01/04/2020 1543   BILITOT 0.7 01/05/2020 1405   BILITOT 0.1 (L) 01/04/2020 1543   BILITOT 0.7 10/22/2016 1408   PROT 7.5 01/05/2020 1405   PROT 8.2 (H) 01/04/2020 1543   ALBUMIN 3.5 01/05/2020 1405   ALBUMIN 3.7 01/04/2020 1543    Studies/Results: DG Abd 1 View  Result Date: 01/07/2020 CLINICAL DATA:  Colonic obstruction. EXAM: ABDOMEN - 1 VIEW COMPARISON:  01/06/2020.  01/05/2020. FINDINGS: Surgical sutures are noted over the abdomen. Prominence of the right, transverse, and possibly sigmoid colon with dilatation up to 7.5 cm noted. Left colonic gas pattern is unremarkable. Hemidiaphragms are incompletely imaged. No free air identified. No prominent stool volume. Degenerative changes lumbar spine. Metallic densities noted over the left hip. IMPRESSION: Prominence of the right, transverse, and possibly sigmoid colon with dilatation up to 7.5 cm. Left colonic gas pattern is unremarkable. No free air identified. Findings may represent colonic ileus. Follow-up exams suggested to exclude developing bowel obstruction. Electronically Signed   By: Marcello Moores  Register   On: 01/07/2020 06:00      [name] 01/08/2020  Patient ID: Luke Wiggins, male   DOB: [birth year], 68 y.o.   MRN: [id number]

## 2020-01-08 NOTE — TOC Initial Note (Signed)
Transition of Care [facility]) - Initial/Assessment Note    Patient Details  Name: Luke Wiggins MRN: [id number] Date of Birth: 1951-04-15  Transition of Care [facility]) CM/SW Contact:    Megumi Treaster, Marjie Skiff, RN Phone Number: 01/08/2020, 2:16 PM  Clinical Narrative:                 Pt is a long term care resident at [facility] and will go back there at discharge.  Expected Discharge Plan: Long Term Nursing Home Barriers to Discharge: Continued Medical Work up   Expected Discharge Plan and Services Expected Discharge Plan: [place]       Living arrangements for the past 2 months: [place]                                      Prior Living Arrangements/Services Living arrangements for the past 2 months: [place] Lives with:: Facility Resident                      Admission diagnosis:  Colon cancer [facility]) [C18.9] Patient Active Problem List   Diagnosis Date Noted  . Colon cancer ([place]) 01/05/2020  . GERD (gastroesophageal reflux disease) 09/08/2015  . Constipation 09/08/2015  . Abdominal pain, generalized   . Essential hypertension   . Thyroid activity decreased   . Hydronephrosis 09/07/2015  . Abdominal pain 09/07/2015  . CKD (chronic kidney disease) stage 3, GFR 30-59 ml/min 09/07/2015  . Hypertension   . Hyperlipidemia   . BPH (benign prostatic hypertrophy)   . Hepatitis C   . COPD (chronic obstructive pulmonary disease) ([place])   . Depression   . Insomnia   . Hypothyroidism    PCP:  Megan Mans, NP Pharmacy:  No Pharmacies Listed    Social Determinants of Health (SDOH) Interventions    Readmission Risk Interventions No flowsheet data found.

## 2020-01-09 NOTE — Progress Notes (Signed)
Assessment & Plan: POD#2 - status post ex lap with low anterior resection of obstructing adenocarcinoma at rectosigmoid junction, descending colostomy             Clear liquid diet             Encourage pulmonary toilet, OOB to chair today, Yankauer suction to bedside             Monitor creatinine - check BMET in AM 1/10             Wound care to begin 1/9 - BID dressing changes wet to dry ordered             Pain better controlled with prn meds given by nurse        [name], MD       [facility]       Office: [phone]   Chief Complaint: Colorectal carcinoma  Subjective: Patient in bed, comfortable.  Taking some liquids.  Objective: Vital signs in last 24 hours: Temp:  [97.7 F (36.5 C)-98.8 F (37.1 C)] 98.8 F (37.1 C) (01/09 0534) Pulse Rate:  [75-81] 81 (01/09 0534) Resp:  [16-18] 16 (01/08 2008) BP: (132-152)/(82-94) 146/94 (01/09 0534) SpO2:  [85 %-96 %] 94 % (01/09 0817) Weight:  [76.7 kg] 76.7 kg (01/09 0732) Last BM Date: 01/08/20(Ostomy)  Intake/Output from previous day: 01/08 0701 - 01/09 0700 In: 2241.7 [P.O.:330; I.V.:1911.7] Out: 1350 [Urine:1275; Stool:75] Intake/Output this shift: No intake/output data recorded.  Physical Exam: HEENT - sclerae clear, mucous membranes moist Neck - soft Abdomen - soft, slight distension; BS present; small liquid output in ostomy bag Ext - no edema, non-tender Neuro - alert & oriented, no focal deficits  Lab Results:  Recent Labs    01/07/20 1717 01/08/20 0443  WBC 20.0* 15.7*  HGB 11.2* 10.5*  HCT 37.1* 34.1*  PLT 267 268   BMET Recent Labs    01/07/20 0545 01/07/20 1717  NA 133*  --   K 4.5  --   CL 105  --   CO2 21*  --   GLUCOSE 109*  --   BUN 21  --   CREATININE 1.70* 1.73*  CALCIUM 8.7*  --    PT/INR No results for input(s): LABPROT, INR in the last 72 hours. Comprehensive Metabolic Panel:    Component Value Date/Time   NA 133 (L) 01/07/2020 0545   NA  133 (L) 01/06/2020 0505   K 4.5 01/07/2020 0545   K 4.6 01/06/2020 0505   CL 105 01/07/2020 0545   CL 103 01/06/2020 0505   CO2 21 (L) 01/07/2020 0545   CO2 22 01/06/2020 0505   BUN 21 01/07/2020 0545   BUN 31 (H) 01/06/2020 0505   CREATININE 1.73 (H) 01/07/2020 1717   CREATININE 1.70 (H) 01/07/2020 0545   CREATININE 1.88 (H) 01/04/2020 1543   GLUCOSE 109 (H) 01/07/2020 0545   GLUCOSE 99 01/06/2020 0505   CALCIUM 8.7 (L) 01/07/2020 0545   CALCIUM 8.7 (L) 01/06/2020 0505   AST 94 (H) 01/05/2020 1405   AST 16 01/04/2020 1543   AST 25 10/22/2016 1408   ALT 18 01/05/2020 1405   ALT 16 01/04/2020 1543   ALT 26 10/22/2016 1408   ALKPHOS 92 01/05/2020 1405   ALKPHOS 105 01/04/2020 1543   BILITOT 0.7 01/05/2020 1405   BILITOT 0.1 (L) 01/04/2020 1543   BILITOT 0.7 10/22/2016 1408   PROT 7.5 01/05/2020 1405   PROT 8.2 (H)  01/04/2020 1543   ALBUMIN 3.5 01/05/2020 1405   ALBUMIN 3.7 01/04/2020 1543    Studies/Results: No results found.    [name] 01/09/2020  Patient ID: Luke Wiggins, male   DOB: November 09, 1951, 69 y.o.   MRN: [id number]
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
## 2020-01-10 LAB — URINALYSIS, ROUTINE W REFLEX MICROSCOPIC
Bilirubin Urine: NEGATIVE
Glucose, UA: NEGATIVE mg/dL
Ketones, ur: NEGATIVE mg/dL
Leukocytes,Ua: NEGATIVE
Nitrite: NEGATIVE
Protein, ur: 100 mg/dL — AB
Specific Gravity, Urine: 1.008 (ref 1.005–1.030)
pH: 7 (ref 5.0–8.0)

## 2020-01-10 LAB — BASIC METABOLIC PANEL
Anion gap: 7 (ref 5–15)
BUN: 19 mg/dL (ref 8–23)
CO2: 20 mmol/L — ABNORMAL LOW (ref 22–32)
Calcium: 8.4 mg/dL — ABNORMAL LOW (ref 8.9–10.3)
Chloride: 103 mmol/L (ref 98–111)
Creatinine, Ser: 1.99 mg/dL — ABNORMAL HIGH (ref 0.61–1.24)
GFR calc Af Amer: 38 mL/min — ABNORMAL LOW (ref >60)
GFR calc non Af Amer: 33 mL/min — ABNORMAL LOW (ref >60)
Glucose, Bld: 127 mg/dL — ABNORMAL HIGH (ref 70–99)
Potassium: 4.8 mmol/L (ref 3.5–5.1)
Sodium: 130 mmol/L — ABNORMAL LOW (ref 135–145)

## 2020-01-10 LAB — CULTURE, RESPIRATORY W GRAM STAIN
Culture: NORMAL
Special Requests: NORMAL

## 2020-01-10 MED ORDER — KCL IN DEXTROSE-NACL 20-5-0.9 MEQ/L-%-% IV SOLN
INTRAVENOUS | Status: DC
  Administered 2020-01-10: 11:00:00 1000 mL via INTRAVENOUS
  Filled 2020-01-10 (×8): qty 1000
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
## 2020-01-10 NOTE — Progress Notes (Signed)
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 Harlow Asa Clear liquid diet - continue for now, some nausea, limited ostomy output Encourage pulmonary toilet, OOB to chair today, Yankauer suction to bedside Monitor creatinine - up to 1.99 this AM   No apparent nephrotoxic drugs   IVF at 100/hr - switch to NS with Na 130 this AM   May need renal consult if continues to rise - repeat in AM 1/11   [place] cath for monitoring output Wound care - BID dressing changes wet to dry with NS Pain better controlled with prn meds given by nurse        Luke Gemma, MD       [facility]       Office: [phone]   Chief Complaint: Obstructing colorectal carcinoma  Subjective: Patient in bed, nurse at bedside.  Comfortable.  Some nausea with clear liquids.  Objective: Vital signs in last 24 hours: Temp:  [98.9 F (37.2 C)-99.6 F (37.6 C)] 98.9 F (37.2 C) (01/10 0619) Pulse Rate:  [86-96] 86 (01/10 0619) Resp:  [16-18] 16 (01/10 0619) BP: (126-142)/(77-84) 142/84 (01/10 0619) SpO2:  [88 %-92 %] 90 % (01/10 0619) Weight:  [74.8 kg] 74.8 kg (01/10 0619) Last BM Date: 01/09/20  Intake/Output from previous day: 01/09 0701 - 01/10 0700 In: 2252.6 [P.O.:360; I.V.:1892.6] Out: 1025 [Urine:1000; Stool:25] Intake/Output this shift: No intake/output data recorded.  Physical Exam: HEENT - sclerae clear, mucous membranes moist Neck - soft Abdomen - soft, protuberant; active BS present; small output in ostomy bag, stoma viable; dressing dry and intact  Lab Results:  Recent Labs    01/07/20 1717 01/08/20 0443  WBC 20.0* 15.7*  HGB 11.2* 10.5*  HCT 37.1* 34.1*  PLT 267 268   BMET Recent Labs    01/07/20 1717 01/10/20 0533  NA  --  130*  K  --  4.8  CL  --  103  CO2  --  20*  GLUCOSE  --   127*  BUN  --  19  CREATININE 1.73* 1.99*  CALCIUM  --  8.4*   PT/INR No results for input(s): LABPROT, INR in the last 72 hours. Comprehensive Metabolic Panel:    Component Value Date/Time   NA 130 (L) 01/10/2020 0533   NA 133 (L) 01/07/2020 0545   K 4.8 01/10/2020 0533   K 4.5 01/07/2020 0545   CL 103 01/10/2020 0533   CL 105 01/07/2020 0545   CO2 20 (L) 01/10/2020 0533   CO2 21 (L) 01/07/2020 0545   BUN 19 01/10/2020 0533   BUN 21 01/07/2020 0545   CREATININE 1.99 (H) 01/10/2020 0533   CREATININE 1.73 (H) 01/07/2020 1717   CREATININE 1.88 (H) 01/04/2020 1543   GLUCOSE 127 (H) 01/10/2020 0533   GLUCOSE 109 (H) 01/07/2020 0545   CALCIUM 8.4 (L) 01/10/2020 0533   CALCIUM 8.7 (L) 01/07/2020 0545   AST 94 (H) 01/05/2020 1405   AST 16 01/04/2020 1543   AST 25 10/22/2016 1408   ALT 18 01/05/2020 1405   ALT 16 01/04/2020 1543   ALT 26 10/22/2016 1408   ALKPHOS 92 01/05/2020 1405   ALKPHOS 105 01/04/2020 1543   BILITOT 0.7 01/05/2020 1405   BILITOT 0.1 (L) 01/04/2020 1543   BILITOT 0.7 10/22/2016 1408   PROT 7.5 01/05/2020 1405   PROT 8.2 (H) 01/04/2020 1543   ALBUMIN 3.5 01/05/2020 1405  ALBUMIN 3.7 01/04/2020 1543    Studies/Results: No results found.    Luke Wiggins 01/10/2020  Patient ID: Luke Wiggins, male   DOB: 1951-04-10, 69 y.o.   MRN: [id number]

## 2020-01-11 LAB — BASIC METABOLIC PANEL
Anion gap: 11 (ref 5–15)
BUN: 17 mg/dL (ref 8–23)
CO2: 22 mmol/L (ref 22–32)
Calcium: 8.7 mg/dL — ABNORMAL LOW (ref 8.9–10.3)
Chloride: 103 mmol/L (ref 98–111)
Creatinine, Ser: 1.78 mg/dL — ABNORMAL HIGH (ref 0.61–1.24)
GFR calc Af Amer: 44 mL/min — ABNORMAL LOW (ref >60)
GFR calc non Af Amer: 38 mL/min — ABNORMAL LOW (ref >60)
Glucose, Bld: 138 mg/dL — ABNORMAL HIGH (ref 70–99)
Potassium: 4.2 mmol/L (ref 3.5–5.1)
Sodium: 136 mmol/L (ref 135–145)

## 2020-01-11 LAB — HEPATIC FUNCTION PANEL
ALT: 14 U/L (ref 0–44)
AST: 12 U/L — ABNORMAL LOW (ref 15–41)
Albumin: 2.7 g/dL — ABNORMAL LOW (ref 3.5–5.0)
Alkaline Phosphatase: 86 U/L (ref 38–126)
Bilirubin, Direct: 0.1 mg/dL (ref 0.0–0.2)
Indirect Bilirubin: 0.4 mg/dL (ref 0.3–0.9)
Total Bilirubin: 0.5 mg/dL (ref 0.3–1.2)
Total Protein: 7.2 g/dL (ref 6.5–8.1)

## 2020-01-11 LAB — TYPE AND SCREEN
ABO/RH(D): O POS
Antibody Screen: NEGATIVE

## 2020-01-11 MED ORDER — ENSURE ENLIVE PO LIQD
237.0000 mL | Freq: Three times a day (TID) | ORAL | Status: DC
  Administered 2020-01-11 – 2020-01-14 (×8): 237 mL via ORAL

## 2020-01-11 MED ORDER — PANTOPRAZOLE SODIUM 40 MG PO TBEC
40.0000 mg | DELAYED_RELEASE_TABLET | Freq: Every day | ORAL | Status: DC
  Administered 2020-01-11 – 2020-01-14 (×2): 40 mg via ORAL
  Filled 2020-01-11 (×3): qty 1

## 2020-01-11 MED ORDER — ACETAMINOPHEN 500 MG PO TABS
1000.0000 mg | ORAL_TABLET | Freq: Four times a day (QID) | ORAL | Status: DC
  Administered 2020-01-11 – 2020-01-14 (×10): 1000 mg via ORAL
  Filled 2020-01-11 (×11): qty 2

## 2020-01-11 MED ORDER — METHOCARBAMOL 500 MG PO TABS
750.0000 mg | ORAL_TABLET | Freq: Four times a day (QID) | ORAL | Status: DC
  Administered 2020-01-11 – 2020-01-14 (×10): 750 mg via ORAL
  Filled 2020-01-11 (×11): qty 2

## 2020-01-11 MED ORDER — DM-GUAIFENESIN ER 30-600 MG PO TB12
1.0000 | ORAL_TABLET | Freq: Two times a day (BID) | ORAL | Status: DC
  Administered 2020-01-11 – 2020-01-14 (×5): 1 via ORAL
  Filled 2020-01-11 (×6): qty 1

## 2020-01-11 MED ORDER — IPRATROPIUM-ALBUTEROL 0.5-2.5 (3) MG/3ML IN SOLN
3.0000 mL | Freq: Four times a day (QID) | RESPIRATORY_TRACT | Status: DC
  Administered 2020-01-11 – 2020-01-14 (×11): 3 mL via RESPIRATORY_TRACT
  Filled 2020-01-11 (×12): qty 3

## 2020-01-11 MED ORDER — LIDOCAINE 5 % EX PTCH
1.0000 | MEDICATED_PATCH | CUTANEOUS | Status: DC
  Administered 2020-01-11 – 2020-01-13 (×3): 1 via TRANSDERMAL
  Filled 2020-01-11 (×4): qty 1

## 2020-01-11 MED ORDER — MORPHINE SULFATE (PF) 2 MG/ML IV SOLN
2.0000 mg | INTRAVENOUS | Status: DC | PRN
  Administered 2020-01-11 – 2020-01-12 (×5): 2 mg via INTRAVENOUS
  Filled 2020-01-11 (×6): qty 1

## 2020-01-11 NOTE — Progress Notes (Signed)

## 2020-01-11 NOTE — Care Management Important Message (Signed)
Important Message  Patient Details IM Letter given to Marney Doctor RN Case Manager to present to the Patient Name: Luke Wiggins MRN: [id number] Date of Birth: 18-Sep-1951   Medicare Important Message Given:  Yes     Kerin Salen 01/11/2020, 10:39 AM
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
## 2020-01-12 ENCOUNTER — Encounter: Payer: Self-pay | Admitting: General Practice

## 2020-01-12 DIAGNOSIS — D649 Anemia, unspecified: Secondary | ICD-10-CM

## 2020-01-12 DIAGNOSIS — C787 Secondary malignant neoplasm of liver and intrahepatic bile duct: Secondary | ICD-10-CM

## 2020-01-12 DIAGNOSIS — C19 Malignant neoplasm of rectosigmoid junction: Principal | ICD-10-CM

## 2020-01-12 DIAGNOSIS — K56609 Unspecified intestinal obstruction, unspecified as to partial versus complete obstruction: Secondary | ICD-10-CM

## 2020-01-12 DIAGNOSIS — N19 Unspecified kidney failure: Secondary | ICD-10-CM

## 2020-01-12 LAB — CBC
HCT: 31.7 % — ABNORMAL LOW (ref 39.0–52.0)
Hemoglobin: 9.4 g/dL — ABNORMAL LOW (ref 13.0–17.0)
MCH: 25.3 pg — ABNORMAL LOW (ref 26.0–34.0)
MCHC: 29.7 g/dL — ABNORMAL LOW (ref 30.0–36.0)
MCV: 85.4 fL (ref 80.0–100.0)
Platelets: 261 10*3/uL (ref 150–400)
RBC: 3.71 MIL/uL — ABNORMAL LOW (ref 4.22–5.81)
RDW: 15.4 % (ref 11.5–15.5)
WBC: 9.5 10*3/uL (ref 4.0–10.5)
nRBC: 0 % (ref 0.0–0.2)

## 2020-01-12 LAB — SURGICAL PATHOLOGY

## 2020-01-12 LAB — PREALBUMIN: Prealbumin: 12.3 mg/dL — ABNORMAL LOW (ref 18–38)

## 2020-01-12 MED ORDER — ENOXAPARIN SODIUM 40 MG/0.4ML ~~LOC~~ SOLN
40.0000 mg | SUBCUTANEOUS | Status: DC
  Administered 2020-01-14: 08:00:00 40 mg via SUBCUTANEOUS
  Filled 2020-01-12: qty 0.4

## 2020-01-12 MED ORDER — MORPHINE SULFATE (PF) 2 MG/ML IV SOLN
2.0000 mg | INTRAVENOUS | Status: DC | PRN
  Administered 2020-01-12 – 2020-01-13 (×3): 2 mg via INTRAVENOUS
  Filled 2020-01-12 (×3): qty 1

## 2020-01-12 MED ORDER — DOCUSATE SODIUM 100 MG PO CAPS
100.0000 mg | ORAL_CAPSULE | Freq: Every day | ORAL | Status: DC
  Administered 2020-01-14: 10:00:00 100 mg via ORAL
  Filled 2020-01-12 (×2): qty 1
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
## 2020-01-12 NOTE — Progress Notes (Deleted)
Pt had a CBG of 53 this am. 8 oz of juice was given.  MD paged. Will recheck in 15 minutes.

## 2020-01-12 NOTE — Consult Note (Signed)
[place] Nurse ostomy follow up Stoma type/location: LLQ descending colostomy Stomal assessment/size: 2" pink and moist Peristomal assessment: itnact Treatment options for stomal/peristomal skin: barrier ring  Output liquid brown stool Ostomy pouching: 1 piece convex pouch with barrier ring  Education provided: Patient understadsn that he wears a bag. Is not able to understand process for self care.  Will need assistance ongoing Enrolled patient in [place] Start Discharge program: Yes Will not follow at this time.  Please re-consult if needed.  Domenic Moras MSN, RN, FNP-BC [facility] Wound, Ostomy, Continence Nurse Pager [phone]
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
## 2020-01-12 NOTE — Progress Notes (Signed)
[place] Psychosocial Distress Screening Clinical Social Work  Clinical Social Work was referred by distress screening protocol.  The patient scored a 5 on the Psychosocial Distress Thermometer which indicates moderate distress. Clinical Social Worker reviewed screen and noted issues were related to physical symptoms to assess for distress and other psychosocial needs.   Noted patient is current inpatient, is long term resident of [facility] and plans to return at discharge.  [facility] [facility] CSWs can stand by to assist as needed, closing Distress Screen referral at this time as social work needs are being addressed in inpatient setting.    ONCBCN DISTRESS SCREENING 01/04/2020  Screening Type Initial Screening  Distress experienced in past week (1-10) 5  Physical Problem type Pain;Loss of appetitie;Constipation/diarrhea  Physician notified of physical symptoms Yes  Referral to clinical psychology No  Referral to clinical social work No  Referral to dietition No  Referral to financial advocate No  Referral to support programs No  Referral to palliative care No    Clinical Social Worker follow up needed: No.  Please reconsult as needed.    If yes, follow up plan:  Beverely Pace, [place], LCSW Clinical Social Worker Phone:  [phone]

## 2020-01-12 NOTE — NC FL2 (Signed)
[place] LEVEL OF CARE SCREENING TOOL     IDENTIFICATION  Patient Name: Luke Wiggins Birthdate: 23-Jun-1951 Sex: male Admission Date (Current Location): 01/05/2020  [facility] and [place] Number:  Herbalist and Address:  [facility],  [place] [address], [place]      Provider Number: [id number]  Attending Physician Name and Address:  Edison Pace, Md, MD  Relative Name and Phone Number:       Current Level of Care: Hospital Recommended Level of Care: [place] Prior Approval Number:    Date Approved/Denied:   PASRR Number: [id number]  Discharge Plan: SNF    Current Diagnoses: Patient Active Problem List   Diagnosis Date Noted  . Colon cancer ([place]) 01/05/2020  . GERD (gastroesophageal reflux disease) 09/08/2015  . Constipation 09/08/2015  . Abdominal pain, generalized   . Essential hypertension   . Thyroid activity decreased   . Hydronephrosis 09/07/2015  . Abdominal pain 09/07/2015  . CKD (chronic kidney disease) stage 3, GFR 30-59 ml/min 09/07/2015  . Hypertension   . Hyperlipidemia   . BPH (benign prostatic hypertrophy)   . Hepatitis C   . COPD (chronic obstructive pulmonary disease) ([place])   . Depression   . Insomnia   . Hypothyroidism     Orientation RESPIRATION BLADDER Height & Weight     Self, Time, Place  Normal Incontinent Weight: 72.1 kg Height:  5\' 7"  (170.2 cm)  BEHAVIORAL SYMPTOMS/MOOD NEUROLOGICAL BOWEL NUTRITION STATUS      Colostomy(New colostomy) Diet  AMBULATORY STATUS COMMUNICATION OF NEEDS Skin   Extensive Assist Verbally Normal                       Personal Care Assistance Level of Assistance  Bathing, Dressing Bathing Assistance: Limited assistance   Dressing Assistance: Limited assistance     Functional Limitations Info             SPECIAL CARE FACTORS FREQUENCY  PT (By licensed PT), OT (By licensed OT)     PT Frequency: 3 x weekly OT Frequency: 3 x weekly             Contractures Contractures Info: Not present    Additional Factors Info  Code Status, Allergies Code Status Info: Full Allergies Info: None known           Current Medications (01/12/2020):  This is the current hospital active medication list Current Facility-Administered Medications  Medication Dose Route Frequency Provider Last Rate Last Admin  . acetaminophen (TYLENOL) tablet 1,000 mg  1,000 mg Oral Q6H Earnstine Regal, PA-C   1,000 mg at 01/12/20 0458  . albuterol (PROVENTIL) (2.5 MG/3ML) 0.083% nebulizer solution 2.5 mg  2.5 mg Nebulization Q6H PRN [name], MD      . amLODipine (NORVASC) tablet 5 mg  5 mg Oral Daily Earnstine Regal, PA-C   5 mg at 01/11/20 1015  . Chlorhexidine Gluconate Cloth 2 % PADS 6 each  6 each Topical Daily [name], MD   6 each at 01/12/20 1046  . dextromethorphan-guaiFENesin (MUCINEX DM) 30-600 MG per 12 hr tablet 1 tablet  1 tablet Oral BID Earnstine Regal, PA-C   1 tablet at 01/11/20 2237  . dextrose 5 % and 0.9 % NaCl with KCl 20 mEq/L infusion   Intravenous Continuous Earnstine Regal, PA-C 50 mL/hr at 01/12/20 1041 Rate Change at 01/12/20 1041  . docusate sodium (COLACE) capsule 100 mg  100 mg Oral Daily Earnstine Regal,  PA-C      . Derrill Memo ON 01/14/2020] enoxaparin (LOVENOX) injection 40 mg  40 mg Subcutaneous Q24H Candiss Norse A, PA-C      . feeding supplement (BOOST / RESOURCE BREEZE) liquid 1 Container  1 Container Oral TID BM Earnstine Regal, PA-C   1 Container at 01/11/20 2238  . feeding supplement (ENSURE ENLIVE) (ENSURE ENLIVE) liquid 237 mL  237 mL Oral TID PC Earnstine Regal, PA-C   237 mL at 01/11/20 1700  . finasteride (PROSCAR) tablet 5 mg  5 mg Oral Daily Earnstine Regal, PA-C   5 mg at 01/11/20 1012  . ipratropium-albuterol (DUONEB) 0.5-2.5 (3) MG/3ML nebulizer solution 3 mL  3 mL Nebulization Q6H Earnstine Regal, PA-C   3 mL at 01/12/20 0841  . levothyroxine (SYNTHROID) tablet 50 mcg  50 mcg Oral Q0600  Earnstine Regal, PA-C   50 mcg at 01/12/20 [id number]  . lidocaine (LIDODERM) 5 % 1 patch  1 patch Transdermal Q24H Earnstine Regal, PA-C   1 patch at 01/11/20 1703  . methocarbamol (ROBAXIN) tablet 750 mg  750 mg Oral QID Earnstine Regal, PA-C   750 mg at 01/11/20 2236  . morphine 2 MG/ML injection 2 mg  2 mg Intravenous Q4H PRN Earnstine Regal, PA-C   2 mg at 01/12/20 1041  . mupirocin ointment (BACTROBAN) 2 %   Nasal BID Earnstine Regal, PA-C   1 application at [id number]  . ondansetron (ZOFRAN) tablet 4 mg  4 mg Oral Q6H PRN [name], MD       Or  . ondansetron [facility]) injection 4 mg  4 mg Intravenous Q6H PRN [name], MD   4 mg at 01/08/20 2115  . oxyCODONE (Oxy IR/ROXICODONE) immediate release tablet 5-10 mg  5-10 mg Oral Q4H PRN Jillyn Ledger, PA-C   10 mg at 01/12/20 [id number]  . pantoprazole (PROTONIX) EC tablet 40 mg  40 mg Oral Daily Leodis Sias T, RPH   40 mg at 01/11/20 1012  . traZODone (DESYREL) tablet 150 mg  150 mg Oral QHS Earnstine Regal, PA-C   150 mg at 01/11/20 2236     Discharge Medications: Please see discharge summary for a list of discharge medications.  Relevant Imaging Results:  Relevant Lab Results:   Additional Information ss# [id number]  Luke Wiggins, Marjie Skiff, RN

## 2020-01-13 ENCOUNTER — Inpatient Hospital Stay: Payer: Medicare Other | Admitting: Oncology

## 2020-01-13 ENCOUNTER — Inpatient Hospital Stay (HOSPITAL_COMMUNITY): Payer: Medicare Other

## 2020-01-13 HISTORY — PX: IR IMAGING GUIDED PORT INSERTION: IMG5740

## 2020-01-13 LAB — CBC
HCT: 30.5 % — ABNORMAL LOW (ref 39.0–52.0)
Hemoglobin: 9.3 g/dL — ABNORMAL LOW (ref 13.0–17.0)
MCH: 25.7 pg — ABNORMAL LOW (ref 26.0–34.0)
MCHC: 30.5 g/dL (ref 30.0–36.0)
MCV: 84.3 fL (ref 80.0–100.0)
Platelets: 286 10*3/uL (ref 150–400)
RBC: 3.62 MIL/uL — ABNORMAL LOW (ref 4.22–5.81)
RDW: 15.4 % (ref 11.5–15.5)
WBC: 10.2 10*3/uL (ref 4.0–10.5)
nRBC: 0 % (ref 0.0–0.2)

## 2020-01-13 LAB — PROTIME-INR
INR: 1 (ref 0.8–1.2)
Prothrombin Time: 13.1 seconds (ref 11.4–15.2)

## 2020-01-13 LAB — SARS CORONAVIRUS 2 (TAT 6-24 HRS): SARS Coronavirus 2: NEGATIVE

## 2020-01-13 MED ORDER — GELATIN ABSORBABLE 12-7 MM EX MISC
CUTANEOUS | Status: AC
  Filled 2020-01-13: qty 1

## 2020-01-13 MED ORDER — FENTANYL CITRATE (PF) 100 MCG/2ML IJ SOLN
INTRAMUSCULAR | Status: AC | PRN
  Administered 2020-01-13 (×2): 25 ug via INTRAVENOUS
  Administered 2020-01-13: 50 ug via INTRAVENOUS

## 2020-01-13 MED ORDER — HEPARIN SOD (PORK) LOCK FLUSH 100 UNIT/ML IV SOLN
INTRAVENOUS | Status: AC | PRN
  Administered 2020-01-13: 500 [IU] via INTRAVENOUS

## 2020-01-13 MED ORDER — FENTANYL CITRATE (PF) 100 MCG/2ML IJ SOLN
INTRAMUSCULAR | Status: AC
  Filled 2020-01-13: qty 2

## 2020-01-13 MED ORDER — LIDOCAINE HCL 1 % IJ SOLN
INTRAMUSCULAR | Status: AC
  Filled 2020-01-13: qty 20

## 2020-01-13 MED ORDER — CEFAZOLIN SODIUM-DEXTROSE 2-4 GM/100ML-% IV SOLN
2.0000 g | Freq: Once | INTRAVENOUS | Status: AC
  Administered 2020-01-13: 16:00:00 2 g via INTRAVENOUS
  Filled 2020-01-13: qty 100

## 2020-01-13 MED ORDER — FENTANYL CITRATE (PF) 100 MCG/2ML IJ SOLN
INTRAMUSCULAR | Status: AC | PRN
  Administered 2020-01-13: 25 ug via INTRAVENOUS
  Administered 2020-01-13: 50 ug via INTRAVENOUS

## 2020-01-13 MED ORDER — MIDAZOLAM HCL 2 MG/2ML IJ SOLN
INTRAMUSCULAR | Status: AC | PRN
  Administered 2020-01-13: 0.5 mg via INTRAVENOUS
  Administered 2020-01-13: 1 mg via INTRAVENOUS

## 2020-01-13 MED ORDER — MIDAZOLAM HCL 2 MG/2ML IJ SOLN
INTRAMUSCULAR | Status: AC
  Filled 2020-01-13: qty 4

## 2020-01-13 MED ORDER — LIDOCAINE-EPINEPHRINE (PF) 2 %-1:200000 IJ SOLN
INTRAMUSCULAR | Status: AC
  Filled 2020-01-13: qty 20

## 2020-01-13 MED ORDER — HEPARIN SOD (PORK) LOCK FLUSH 100 UNIT/ML IV SOLN
INTRAVENOUS | Status: AC
  Filled 2020-01-13: qty 5

## 2020-01-13 MED ORDER — MIDAZOLAM HCL 2 MG/2ML IJ SOLN
INTRAMUSCULAR | Status: AC | PRN
  Administered 2020-01-13: 1 mg via INTRAVENOUS
  Administered 2020-01-13: 0.5 mg via INTRAVENOUS
  Administered 2020-01-13: 1 mg via INTRAVENOUS

## 2020-01-13 NOTE — Progress Notes (Signed)
Nutrition Follow-up  INTERVENTION:   -D/c Boost Breeze -Ensure Enlive po BID, each supplement provides 350 kcal and 20 grams of protein  NUTRITION DIAGNOSIS:   Increased nutrient needs related to cancer and cancer related treatments, post-op healing as evidenced by estimated needs.  Ongoing.  GOAL:   Patient will meet greater than or equal to 90% of their needs  Progressing.  MONITOR:   Diet advancement, Labs, Weight trends, I & O's, PO intake, supplemental intake  ASSESSMENT:   69 year old male with an obstructing rectosigmoid colon cancer.  He underwent CT scans of the chest, abdomen and pelvis.  He has 2 lesions in his liver that are concerning for metastatic disease.  He reports significant abdominal pain for the past month.  He is having a lot of back pain and right lower quadrant pain currently. Admitted for urgent surgery.  1/7: s/p Low anterior resection of rectosigmoid colon with descending colostomy 1/8: CLD 1/11: FLD 1/12: Soft diet  **RD working remotely**  Patient was tolerating full liquids. Diet was just advanced to soft diet yesterday. Not much PO documented. Pt now NPO today for liver biopsy and PAC placement via IR. Per surgery note, soft diet will be resumed following procedures. Plan will be for SNF at discharge. Will d/c Boost Breeze and continue Ensure supplements for once diet is advanced back to soft.  Admission weight: 160 lbs. Current weight: 158 lbs.   I/Os: +5.3L since admit UOP: 1650 ml x 24 hrs  Labs reviewed. Medications reviewed.   Diet Order:   Diet Order            Diet NPO time specified  Diet effective midnight              EDUCATION NEEDS:   No education needs have been identified at this time  Skin:  Skin Assessment: Reviewed RN Assessment  Last BM:  1/12 -type 7  Height:   Ht Readings from Last 1 Encounters:  01/07/20 5\' 7"  (1.702 m)    Weight:   Wt Readings from Last 1 Encounters:  01/12/20 72.1 kg     Ideal Body Weight:  67.2 kg  BMI:  Body mass index is 24.89 kg/m.  Estimated Nutritional Needs:   Kcal:  2200-2400  Protein:  95-105g  Fluid:  2.2L/day   Clayton Bibles, MS, RD, LDN Inpatient Clinical Dietitian Pager: [phone] After Hours Pager: [phone]

## 2020-01-13 NOTE — Procedures (Signed)
Interventional Radiology Procedure Note  Procedure:   1.) Placement of a right IJ approach single lumen PowerPort.  Tip is positioned at the superior cavoatrial junction and catheter is ready for immediate use.  2.) US guided liver lesion biopsy  Complications: No immediate Recommendations:  - Ok to shower tomorrow - Do not submerge for 7 days - Routine line care   Signed,  Criselda Peaches, MD

## 2020-01-13 NOTE — Progress Notes (Signed)
Arrived to unit, RN reports pt has been taken down for procedure. Will re-enter order if needed upon return.

## 2020-01-13 NOTE — Sedation Documentation (Signed)
Patient is resting comfortably. 
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
## 2020-01-13 NOTE — Discharge Instructions (Addendum)
. Implanted [facility] Guide An implanted port is a device that is placed under the skin. It is usually placed in the chest. The device can be used to give IV medicine, to take blood, or for dialysis. You may have an implanted port if:  You need IV medicine that would be irritating to the small veins in your hands or arms.  You need IV medicines, such as antibiotics, for a long period of time.  You need IV nutrition for a long period of time.  You need dialysis. Having a port means that your health care provider will not need to use the veins in your arms for these procedures. You may have fewer limitations when using a port than you would if you used other types of long-term IVs, and you will likely be able to return to normal activities after your incision heals. An implanted port has two main parts:  Reservoir. The reservoir is the part where a needle is inserted to give medicines or draw blood. The reservoir is round. After it is placed, it appears as a small, raised area under your skin.  Catheter. The catheter is a thin, flexible tube that connects the reservoir to a vein. Medicine that is inserted into the reservoir goes into the catheter and then into the vein. How is my port accessed? To access your port:  A numbing cream may be placed on the skin over the port site.  Your health care provider will put on a mask and sterile gloves.  The skin over your port will be cleaned carefully with a germ-killing soap and allowed to dry.  Your health care provider will gently pinch the port and insert a needle into it.  Your health care provider will check for a blood return to make sure the port is in the vein and is not clogged.  If your port needs to remain accessed to get medicine continuously (constant infusion), your health care provider will place a clear bandage (dressing) over the needle site. The dressing and needle will need to be changed every week, or as told by your health  care provider. What is flushing? Flushing helps keep the port from getting clogged. Follow instructions from your health care provider about how and when to flush the port. Ports are usually flushed with saline solution or a medicine called heparin. The need for flushing will depend on how the port is used:  If the port is only used from time to time to give medicines or draw blood, the port may need to be flushed: ? Before and after medicines have been given. ? Before and after blood has been drawn. ? As part of routine maintenance. Flushing may be recommended every 4-6 weeks.  If a constant infusion is running, the port may not need to be flushed.  Throw away any syringes in a disposal container that is meant for sharp items (sharps container). You can buy a sharps container from a pharmacy, or you can make one by using an empty hard plastic bottle with a cover. How long will my port stay implanted? The port can stay in for as long as your health care provider thinks it is needed. When it is time for the port to come out, a surgery will be done to remove it. The surgery will be similar to the procedure that was done to put the port in. Follow these instructions at home:   Flush your port as told by your health care  provider.  If you need an infusion over several days, follow instructions from your health care provider about how to take care of your port site. Make sure you: ? Wash your hands with soap and water before you change your dressing. If soap and water are not available, use alcohol-based hand sanitizer. ? Change your dressing as told by your health care provider. ? Place any used dressings or infusion bags into a plastic bag. Throw that bag in the trash. ? Keep the dressing that covers the needle clean and dry. Do not get it wet. ? Do not use scissors or sharp objects near the tube. ? Keep the tube clamped, unless it is being used.  Check your port site every day for signs of  infection. Check for: ? Redness, swelling, or pain. ? Fluid or blood. ? Pus or a bad smell.  Protect the skin around the port site. ? Avoid wearing bra straps that rub or irritate the site. ? Protect the skin around your port from seat belts. Place a soft pad over your chest if needed.  Bathe or shower as told by your health care provider. The site may get wet as long as you are not actively receiving an infusion.  Return to your normal activities as told by your health care provider. Ask your health care provider what activities are safe for you.  Carry a medical alert card or wear a medical alert bracelet at all times. This will let health care providers know that you have an implanted port in case of an emergency. Get help right away if:  You have redness, swelling, or pain at the port site.  You have fluid or blood coming from your port site.  You have pus or a bad smell coming from the port site.  You have a fever. Summary  Implanted ports are usually placed in the chest for long-term IV access.  Follow instructions from your health care provider about flushing the port and changing bandages (dressings).  Take care of the area around your port by avoiding clothing that puts pressure on the area, and by watching for signs of infection.  Protect the skin around your port from seat belts. Place a soft pad over your chest if needed.  Get help right away if you have a fever or you have redness, swelling, pain, drainage, or a bad smell at the port site. This information is not intended to replace advice given to you by your health care provider. Make sure you discuss any questions you have with your health care provider. Document Revised: 04/10/2019 Document Reviewed: 01/19/2017 Elsevier Patient Education  2020 [facility] Stay Proper nutrition can help your body recover from illness and injury.   Foods and beverages high in protein, vitamins, and  minerals help rebuild muscle loss, promote healing, & reduce fall risk.   .In addition to eating healthy foods, a nutrition shake is an easy, delicious way to get the nutrition you need during and after your hospital stay  It is recommended that you continue to drink 2 bottles per day of:       Ensure or Boost for at least 1 month (30 days) after your hospital stay   Tips for adding a nutrition shake into your routine: As allowed, drink one with vitamins or medications instead of water or juice Enjoy one as a tasty mid-morning or afternoon snack Drink cold or make a milkshake out of it Drink one instead of  milk with cereal or snacks Use as a coffee creamer   Available at the following grocery stores and pharmacies:           * [place] visit: www.ensure.com/join or http://dawson-may.com/   Suggested Substitutions Ensure Plus = Boost Plus = Carnation Breakfast Essentials = Boost Compact Ensure Active Clear = Boost Breeze Glucerna Shake = Boost Glucose Control = Carnation Breakfast Essentials SUGAR FREE   CCS      [facility], [place] [phone]  OPEN ABDOMINAL SURGERY: POST OP INSTRUCTIONS  Always review your discharge instruction sheet given to you by the facility where your surgery was performed.  IF YOU HAVE DISABILITY OR FAMILY LEAVE FORMS, YOU MUST BRING THEM TO THE OFFICE FOR PROCESSING.  PLEASE DO NOT GIVE THEM TO YOUR DOCTOR.  1. A prescription for pain medication may be given to you upon discharge.  Take your pain medication as prescribed, if needed.  If narcotic pain medicine is not needed, then you may take acetaminophen (Tylenol) or ibuprofen (Advil) as needed. 2. Take your usually prescribed medications unless otherwise directed. 3. If you need a refill  on your pain medication, please contact your pharmacy. They will contact our office to request authorization.  Prescriptions will not be filled after 5pm or on week-ends. 4. You should follow a light diet the first few days after arrival home, such as soup and crackers, pudding, etc.unless your doctor has advised otherwise. A high-fiber, low fat diet can be resumed as tolerated.   Be sure to include lots of fluids daily. Most patients will experience some swelling and bruising on the chest and neck area.  Ice packs will help.  Swelling and bruising can take several days to resolve 5. Most patients will experience some swelling and bruising in the area of the incision. Ice pack will help. Swelling and bruising can take several days to resolve..  6. It is common to experience some constipation if taking pain medication after surgery.  Increasing fluid intake and taking a stool softener will usually help or prevent this problem from occurring.  A mild laxative (Milk of Magnesia or Miralax) should be taken according to package directions if there are no bowel movements after 48 hours. 7.  You may have steri-strips (small skin tapes) in place directly over the incision.  These strips should be left on the skin for 7-10 days.  If your surgeon used skin glue on the incision, you may shower in 24 hours.  The glue will flake off over the next 2-3 weeks.  Any sutures or staples will be removed at the office during your follow-up visit. You may find that a light gauze bandage over your incision may keep your staples from being rubbed or pulled. You may shower and replace the bandage daily. 8. ACTIVITIES:  You may resume regular (light) daily activities beginning the next day--such as daily self-care, walking, climbing stairs--gradually increasing activities as tolerated.  You may have sexual intercourse when it is comfortable.  Refrain from any heavy lifting or straining until approved by your doctor. a. You may drive  when you no longer  are taking prescription pain medication, you can comfortably wear a seatbelt, and you can safely maneuver your car and apply brakes b. Return to Work: ___________________________________ 79. You should see your doctor in the office for a follow-up appointment approximately two weeks after your surgery.  Make sure that you call for this appointment within a day or two after you arrive home to insure a convenient appointment time. OTHER INSTRUCTIONS:  _____________________________________________________________ _____________________________________________________________  WHEN TO CALL YOUR DOCTOR: 1. Fever over 101.0 2. Inability to urinate 3. Nausea and/or vomiting 4. Extreme swelling or bruising 5. Continued bleeding from incision. 6. Increased pain, redness, or drainage from the incision. 7. Difficulty swallowing or breathing 8. Muscle cramping or spasms. 9. Numbness or tingling in hands or feet or around lips.  The clinic staff is available to answer your questions during regular business hours.  Please don't hesitate to call and ask to speak to one of the nurses if you have concerns.  For further questions, please visit [email or web address]   Colostomy Home Guide, Adult  Colostomy surgery is done to create an opening in the front of the abdomen for stool (feces) to leave the body through an ostomy (stoma). Part of the large intestine is attached to the stoma. A bag, also called a pouch, is fitted over the stoma. Stool and gas will collect in the bag. After surgery, you will need to empty and change your colostomy bag as needed. You will also need to care for your stoma. How to care for the stoma Your stoma should look pink, red, and moist, like the inside of your cheek. Soon after surgery, the stoma may be swollen, but this swelling will go away within 6 weeks. To care for the stoma:  Keep the skin around the stoma clean and dry.  Use a clean, soft  washcloth to gently wash the stoma and the skin around it. Clean using a circular motion, and wipe away from the stoma opening, not toward it. ? Use warm water and only use cleansers recommended by your health care provider. ? Rinse the stoma area with plain water. ? Dry the area around the stoma well.  Use stoma powder or ointment on your skin only as told by your health care provider. Do not use any other powders, gels, wipes, or creams on the skin around the stoma.  Check the stoma area every day for signs of infection. Check for: ? New or worsening redness, swelling, or pain. ? New or increased fluid or blood. ? Pus or warmth.  Measure the stoma opening regularly and record the size. Watch for changes. (It is normal for the stoma to get smaller as swelling goes away.) Share this information with your health care provider. How to empty the colostomy bag  Empty your bag at bedtime and whenever it is one-third to one-half full. Do not let the bag get more than half-full with stool or gas. The bag could leak if it gets too full. Some colostomy bags have a built-in gas release valve that releases gas often throughout the day. Follow these basic steps: 1. Wash your hands with soap and water. 2. Sit far back on the toilet seat. 3. Put several pieces of toilet paper into the toilet water. This will prevent splashing as you empty stool into the toilet. 4. Remove the clip or the hook-and-loop fastener from the tail end of the bag. 5. Unroll the tail, then empty the stool into the toilet. 6. Clean the tail with  toilet paper or a moist towelette. 7. Reroll the tail, and close it with the clip or the hook-and-loop fastener. 8. Wash your hands again. How to change the colostomy bag Change your bag every 3-4 days or as often as told by your health care provider. Also change the bag if it is leaking or separating from the skin, or if your skin around the stoma looks or feels irritated. Irritated skin  may be a sign that the bag is leaking. Always have colostomy supplies with you, and follow these basic steps: 1. Wash your hands with soap and water. Have paper towels or tissues nearby to clean any discharge. 2. Remove the old bag and skin barrier. Use your fingers or a warm cloth to gently push the skin away from the barrier. 3. Clean the stoma area with water or with mild soap and water, as directed. Use water to rinse away any soap. 4. Dry the skin. You may use the cool setting on a hair dryer to do this. 5. Use a tracing pattern (template) to cut the skin barrier to the size needed. 6. If you are using a two-piece bag, attach the bag and the skin barrier to each other. Add the barrier ring, if you use one. 7. If directed, apply stoma powder or skin barrier gel to the skin. 8. Warm the skin barrier with your hands, or blow with a hair dryer for 5-10 seconds. 9. Remove the paper from the adhesive strip of the skin barrier. 10. Press the adhesive strip onto the skin around the stoma. 11. Gently rub the skin barrier onto the skin. This creates heat that helps the barrier to stick. 12. Apply stoma tape to the edges of the skin barrier, if desired. 78. Wash your hands again. General recommendations  Avoid wearing tight clothes or having anything press directly on your stoma or bag. Change your clothing whenever it is soiled or damp.  You may shower or bathe with the bag on or off. Do not use harsh or oily soaps or lotions. Dry the skin and bag after bathing.  Store all supplies in a cool, dry place. Do not leave supplies in extreme heat because some parts can melt or not stick as well.  Whenever you leave home, take extra clothing and an extra skin barrier and bag with you.  If your bag gets wet, you can dry it with a hair dryer on the cool setting.  To prevent odor, you may put drops of ostomy deodorizer in the bag.  If recommended by your health care provider, put ostomy lubricant  inside the bag. This helps stool to slide out of the bag more easily and completely. Contact a health care provider if:  You have new or worsening redness, swelling, or pain around your stoma.  You have new or increased fluid or blood coming from your stoma.  Your stoma feels warm to the touch.  You have pus coming from your stoma.  Your stoma extends in or out farther than normal.  You need to change your bag every day.  You have a fever. Get help right away if:  Your stool is bloody.  You have nausea or you vomit.  You have trouble breathing. Summary  Measure your stoma opening regularly and record the size. Watch for changes.  Empty your bag at bedtime and whenever it is one-third to one-half full. Do not let the bag get more than half-full with stool or gas.  Change your  bag every 3-4 days or as often as told by your health care provider.  Whenever you leave home, take extra clothing and an extra skin barrier and bag with you. This information is not intended to replace advice given to you by your health care provider. Make sure you discuss any questions you have with your health care provider. Document Revised: 04/08/2019 Document Reviewed: 06/12/2017 Elsevier Patient Education  [place].   Mechanical Wound Debridement Mechanical wound debridement is a treatment to remove dead tissue from a wound. This helps the wound heal. The treatment involves cleaning the wound by using mechanical force. This may include:  Wound irrigation. This method involves rinsing the wound out with fluid.  Wet-to-dry dressing. This method involves putting wet dressings into the wound, allowing them to dry, then removing the dry dressings to remove debris and tissue from the wound.  Using a pad or gauze to remove dead tissue and debris from the wound.  Pulsatile lavage. This method involves cleaning the wound with a pressurized stream of fluid. Depending on the wound, you may  need to repeat this procedure or change to another form of debridement as your wound starts to heal. Tell a health care provider about:  Any allergies you have.  All medicines you are taking, including vitamins, herbs, eye drops, creams, and over-the-counter medicines.  Any blood disorders you have.  Any medical conditions you have, or have had, especially conditions that cause a decrease in blood flow to the wound area, such as peripheral vascular disease, or conditions that affect your body's defense (immune) system or white blood count.  Any surgeries you have had.  Whether you are pregnant or may be pregnant. What are the risks? Generally, this is a safe procedure. However, problems may occur, including:  Infection.  Bleeding.  Damage to healthy tissue in and around your wound.  Soreness or pain.  Failure of the wound to heal.  Scarring. What happens during the procedure?   Your health care provider may apply a numbing medicine (topical anesthetic) to the wound.  Your health care provider may irrigate your wound with a germ-free salt solution (saline). This helps to loosen or remove debris, bacteria, and dead tissue.  Depending on the type of mechanical wound debridement you are having, your health care provider may do one of the following: ? Put a dressing on your wound. You may have a dry gauze pad placed into the wound. Your health care provider will remove the gauze after the wound is dry. Any dead tissue and debris that has dried into the gauze will be lifted out of the wound (wet-to-dry debridement). ? Use a type of pad (monofilament fiber debridement pad) that has a fluffy surface on one side that picks up dead tissue and debris from your wound. Your health care provider wets the pad and wipes it over your wound for several minutes. ? Flush (irrigate) your wound with a pressurized stream of solution such as saline or water (pulsatile lavage).  Once your health care  provider is finished, he or she may apply a light dressing to your wound. The procedure may vary among health care providers and hospitals. What happens after the procedure?  You may get medicine for pain. Summary  Mechanical wound debridement is a treatment to remove dead tissue from a wound. This helps the wound heal.  Depending on the wound, you may need to repeat this procedure or change to another form of debridement as your wound  starts to heal.  This treatment may include: wound irrigation, wet-to-dry dressing, using a pad or gauze to remove dead tissue and debris from the wound, or pulsatile lavage. This information is not intended to replace advice given to you by your health care provider. Make sure you discuss any questions you have with your health care provider. Document Revised: 11/24/2018 Document Reviewed: 11/24/2018 Elsevier Patient Education  2020 [employer].

## 2020-01-14 LAB — BASIC METABOLIC PANEL
Anion gap: 10 (ref 5–15)
BUN: 37 mg/dL — ABNORMAL HIGH (ref 8–23)
CO2: 20 mmol/L — ABNORMAL LOW (ref 22–32)
Calcium: 8.9 mg/dL (ref 8.9–10.3)
Chloride: 104 mmol/L (ref 98–111)
Creatinine, Ser: 2.05 mg/dL — ABNORMAL HIGH (ref 0.61–1.24)
GFR calc Af Amer: 37 mL/min — ABNORMAL LOW (ref >60)
GFR calc non Af Amer: 32 mL/min — ABNORMAL LOW (ref >60)
Glucose, Bld: 107 mg/dL — ABNORMAL HIGH (ref 70–99)
Potassium: 4.8 mmol/L (ref 3.5–5.1)
Sodium: 134 mmol/L — ABNORMAL LOW (ref 135–145)

## 2020-01-14 LAB — CBC
HCT: 32.8 % — ABNORMAL LOW (ref 39.0–52.0)
Hemoglobin: 10 g/dL — ABNORMAL LOW (ref 13.0–17.0)
MCH: 25.6 pg — ABNORMAL LOW (ref 26.0–34.0)
MCHC: 30.5 g/dL (ref 30.0–36.0)
MCV: 83.9 fL (ref 80.0–100.0)
Platelets: 318 10*3/uL (ref 150–400)
RBC: 3.91 MIL/uL — ABNORMAL LOW (ref 4.22–5.81)
RDW: 15.6 % — ABNORMAL HIGH (ref 11.5–15.5)
WBC: 9.8 10*3/uL (ref 4.0–10.5)
nRBC: 0 % (ref 0.0–0.2)

## 2020-01-14 LAB — SURGICAL PATHOLOGY

## 2020-01-14 MED ORDER — CALCIUM CARBONATE ANTACID 500 MG PO CHEW
2.0000 | CHEWABLE_TABLET | Freq: Four times a day (QID) | ORAL | Status: DC | PRN
  Administered 2020-01-14: 08:00:00 400 mg via ORAL
  Filled 2020-01-14: qty 2

## 2020-01-14 MED ORDER — OXYCODONE HCL 5 MG PO TABS: 5.0000 mg | ORAL_TABLET | ORAL | 0 refills | Status: AC | PRN

## 2020-01-14 MED ORDER — METHOCARBAMOL 750 MG PO TABS: 750.0000 mg | ORAL_TABLET | Freq: Four times a day (QID) | ORAL | 0 refills | Status: AC

## 2020-01-14 MED ORDER — ACETAMINOPHEN 500 MG PO TABS: ORAL_TABLET | ORAL | 0 refills | Status: AC

## 2020-01-14 MED ORDER — LIDOCAINE 5 % EX PTCH: 1.0000 | MEDICATED_PATCH | CUTANEOUS | 0 refills | Status: AC

## 2020-01-14 MED ORDER — MUPIROCIN 2 % EX OINT: TOPICAL_OINTMENT | Freq: Two times a day (BID) | CUTANEOUS | 0 refills | Status: AC

## 2020-01-14 NOTE — Plan of Care (Signed)

## 2020-01-14 NOTE — TOC Transition Note (Signed)
Transition of Care [facility]) - CM/SW Discharge Note   Patient Details  Name: Luke Wiggins MRN: [id number] Date of Birth: 07/31/1951  [facility] CM/SW Contact:  Lynnell Catalan, RN Phone Number: 01/14/2020, 12:44 PM   Clinical Narrative:     Pt from [facility] and to DC back there today. RN to call report to [phone]. [facility] contacted for pickup.  Final next level of care: Long Term Nursing Home Barriers to Discharge: Continued Medical Work up           Social Determinants of Health (SDOH) Interventions     Readmission Risk Interventions No flowsheet data found.

## 2020-01-14 NOTE — Progress Notes (Signed)
Pt VS WNL prior to discharge.  Fluids d/c'd and L wrist PIV removed.  Cathetar intact and clotting within normal time frame.  Report called to [place], Therapist, sports at [facility].  Informed her of pt wheelchair that is still at our facility.  She stated that someone from [facility] would come to pick it up.  Pt transported via EMS and all documentation provided to EMS staff.
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
## 2020-01-14 NOTE — Progress Notes (Signed)
Spoke with Mendel Corning in regards to patient's personal wheelchair being left after discharge.  She informed me that she would let transport know for them to come pick it up.  Will leave at nursing station until they arrive.
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
## 2020-01-18 ENCOUNTER — Telehealth: Payer: Self-pay | Admitting: Oncology

## 2020-01-18 NOTE — Telephone Encounter (Signed)
Returned patient's phone call regarding rescheduling 01/26 appointment, per patient's request appointment has moved to 01/25.

## 2020-01-19 ENCOUNTER — Encounter: Payer: Self-pay | Admitting: *Deleted

## 2020-01-19 NOTE — Progress Notes (Signed)
Confirmed with Luke Wiggins in transportation at [facility] of appointment on 01/25/20 at 2:15 pm

## 2020-01-22 ENCOUNTER — Other Ambulatory Visit: Payer: Self-pay

## 2020-01-22 ENCOUNTER — Non-Acute Institutional Stay: Payer: Medicare Other | Admitting: Hospice

## 2020-01-22 DIAGNOSIS — Z515 Encounter for palliative care: Secondary | ICD-10-CM

## 2020-01-22 DIAGNOSIS — C189 Malignant neoplasm of colon, unspecified: Secondary | ICD-10-CM

## 2020-01-22 NOTE — Progress Notes (Addendum)
[facility] Consult Note Telephone: [phone]  Fax: [phone]  PATIENT NAME: Luke Wiggins DOB: 1951/06/06 MRN: [id number]  PRIMARY CARE PROVIDER:   Megan Mans, NP  REFERRING PROVIDER: Dr. Vernell Morgans RESPONSIBLE PARTY:   Self Contact: Siater- Consuelo Pandy [phone]    RECOMMENDATIONS/PLAN:   Advance Care Planning/Goals of Care:  Visit consisted of building trust and discussions on Palliative Medicine as specialized medical care for people living with serious illness, aimed at facilitating better quality of life through symptoms relief, assisting with advance care plan and establishing goals of care. Patient affirmed he is a DNR. DNR in facility chart uploaded to [facility] today. Goals of care include to maximize quality of life and symptom management. Spoke with Pamala Hurry updating her on palliative care and patient's status. She expressed thanks. Symptom management: Patient endorsed ongoing moderate back pain related to Colorectal CA, moderately differentiated with possible liver metastasis. Nursing staff informed to give Oxycodone as ordered. Patient had low anterior resection of rectosigmoid colon with descending colostomy 01/07/2020. Colostomy patient without signs of infection. Patient said there is plan for chemotherapy in the future; Port at [facility] chest.  No report of recent COPD exacerbation; gets his breathing treatments as needed/ ordered. Patient is followed by Psych for depression and insomnia. Encouraged on going nursing care.  Later spoke with Sabra Heck NP concerning pain. She has initiated today Gabapentin 200mg  at night time. This could be increased to BID if indicated.  Follow up: Palliative care will continue to follow patient for goals of care clarification and symptom management. I spent 60  minutes providing this consultation.  More than 50% of the time in this consultation was spent on coordinating communication  HISTORY OF  PRESENT ILLNESS:  [name] is a 69 y.o. year old male with multiple medical problems including Colonrectal CA COPD, depression , Insomnia. Palliative Care was asked to help address goals of care.   CODE STATUS: DNR  PPS: 50% HOSPICE ELIGIBILITY/DIAGNOSIS: TBD  PAST MEDICAL HISTORY:  Past Medical History:  Diagnosis Date  . BPH (benign prostatic hypertrophy)   . Cancer ([facility])   . Chronic constipation   . Chronic pain   . COPD (chronic obstructive pulmonary disease) ([facility])   . Depression   . Hepatitis C   . Hyperlipidemia   . Hypertension   . Hyponatremia   . Hypothyroidism   . Insomnia     SOCIAL HX:  Social History   Tobacco Use  . Smoking status: Current Some Day Smoker    Types: Cigarettes  . Smokeless tobacco: Never Used  Substance Use Topics  . Alcohol use: No    ALLERGIES: No Known Allergies   PERTINENT MEDICATIONS:  Outpatient Encounter Medications as of 01/22/2020  Medication Sig  . acetaminophen (TYLENOL) 500 MG tablet 1000 mg every 6 hours as needed for pain control.  As pain improves this can be changed to every 6 HOURS as needed. Do not exceed 4000 mg of Tylenol/acetaminophen per day it can harm your liver.  Marland Kitchen albuterol (PROAIR HFA) 108 (90 Base) MCG/ACT inhaler Inhale 2 puffs into the lungs every 6 (six) hours as needed for wheezing or shortness of breath.  Marland Kitchen amLODipine (NORVASC) 5 MG tablet Take 5 mg by mouth daily.  Marland Kitchen aspirin 81 MG chewable tablet Chew 81 mg by mouth daily.  Marland Kitchen atorvastatin (LIPITOR) 10 MG tablet Take 10 mg by mouth daily.  . calcium carbonate (TUMS EX) 750 MG chewable tablet Chew 1  tablet by mouth 2 (two) times daily as needed for heartburn.  . cetirizine (ZYRTEC) 10 MG tablet Take 10 mg by mouth daily.  Marland Kitchen dextromethorphan-guaiFENesin (MUCINEX DM) 30-600 MG 12hr tablet Take 2 tablets by mouth every 12 (twelve) hours as needed for cough.  . docusate sodium (COLACE) 100 MG capsule Take 100 mg by mouth daily.   . eszopiclone (LUNESTA) 1 MG  TABS tablet Take 1 mg by mouth at bedtime. Take immediately before bedtime   . feeding supplement (BOOST / RESOURCE BREEZE) LIQD Take 60 mLs by mouth 3 (three) times daily between meals.  Marland Kitchen FERRETTS 325 (106 Fe) MG TABS tablet Take 1 tablet by mouth daily.  . finasteride (PROSCAR) 5 MG tablet Take 5 mg by mouth daily.  Marland Kitchen levothyroxine (SYNTHROID) 50 MCG tablet Take 50 mcg by mouth daily.  . Melatonin 3 MG TABS Take 6 mg by mouth at bedtime.  . Menthol-Methyl Salicylate (MUSCLE RUB EX) Apply 1 application topically 3 (three) times daily as needed (sore muscles).   . mupirocin ointment (BACTROBAN) 2 % Place into the nose 2 (two) times daily.  . nitroGLYCERIN (NITROSTAT) 0.3 MG SL tablet Place 0.3 mg under the tongue every 5 (five) minutes as needed for chest pain.  Marland Kitchen oxyCODONE (OXY IR/ROXICODONE) 5 MG immediate release tablet Take 1 tablet (5 mg total) by mouth every 4 (four) hours as needed for severe pain or breakthrough pain.  . pantoprazole (PROTONIX) 40 MG tablet Take 40 mg by mouth daily.  . traZODone (DESYREL) 50 MG tablet Take 125 mg by mouth at bedtime.    No facility-administered encounter medications on file as of 01/22/2020.    PHYSICAL EXAM/ROS:   General: cooperative, not in acute distress Cardiovascular: regular rate and rhythm; denied chest pain Pulmonary: normal respiratory effort, denied SOB, no coughing Abdomen: Colostomy to LLQ, bandage on incision in mid abdomen, clean dry and intact Extremities: no edema, no joint deformities Skin: no rashes to exposed skin Neurological: Weakness but otherwise nonfocal  Teodoro Spray, NP

## 2020-01-25 ENCOUNTER — Inpatient Hospital Stay: Payer: Medicare Other | Admitting: Nurse Practitioner

## 2020-01-25 ENCOUNTER — Telehealth: Payer: Self-pay | Admitting: *Deleted

## 2020-01-25 ENCOUNTER — Telehealth: Payer: Self-pay | Admitting: Nurse Practitioner

## 2020-01-25 NOTE — Telephone Encounter (Signed)
I could not reach patient regarding cancelled 1/25 and reschedule to 1/27

## 2020-01-25 NOTE — Progress Notes (Deleted)
Sent schedule message to schedulers to reschedule patients appointment  Today to Wednesday 01/27/20@ 10:15a

## 2020-01-25 NOTE — Telephone Encounter (Signed)
Spoke w/facility to cancel appointment today and reschedule to 01/27/20 at 10:15.

## 2020-01-26 ENCOUNTER — Ambulatory Visit: Payer: Medicare Other | Admitting: Nurse Practitioner

## 2020-01-27 ENCOUNTER — Encounter: Payer: Self-pay | Admitting: Nurse Practitioner

## 2020-01-27 ENCOUNTER — Other Ambulatory Visit: Payer: Self-pay

## 2020-01-27 ENCOUNTER — Inpatient Hospital Stay (HOSPITAL_BASED_OUTPATIENT_CLINIC_OR_DEPARTMENT_OTHER): Payer: Medicare Other | Admitting: Nurse Practitioner

## 2020-01-27 VITALS — BP 116/79 | HR 100 | Temp 98.9°F | Resp 18 | Ht 67.0 in | Wt 146.8 lb

## 2020-01-27 DIAGNOSIS — C20 Malignant neoplasm of rectum: Secondary | ICD-10-CM | POA: Diagnosis not present

## 2020-01-27 DIAGNOSIS — J449 Chronic obstructive pulmonary disease, unspecified: Secondary | ICD-10-CM | POA: Diagnosis not present

## 2020-01-27 DIAGNOSIS — K5900 Constipation, unspecified: Secondary | ICD-10-CM | POA: Diagnosis not present

## 2020-01-27 DIAGNOSIS — C187 Malignant neoplasm of sigmoid colon: Secondary | ICD-10-CM | POA: Diagnosis not present

## 2020-01-27 DIAGNOSIS — E039 Hypothyroidism, unspecified: Secondary | ICD-10-CM | POA: Diagnosis not present

## 2020-01-27 DIAGNOSIS — Z905 Acquired absence of kidney: Secondary | ICD-10-CM | POA: Diagnosis not present

## 2020-01-27 DIAGNOSIS — Z933 Colostomy status: Secondary | ICD-10-CM | POA: Diagnosis not present

## 2020-01-27 DIAGNOSIS — C19 Malignant neoplasm of rectosigmoid junction: Secondary | ICD-10-CM | POA: Diagnosis not present

## 2020-01-27 DIAGNOSIS — F17210 Nicotine dependence, cigarettes, uncomplicated: Secondary | ICD-10-CM | POA: Diagnosis not present

## 2020-01-27 DIAGNOSIS — D649 Anemia, unspecified: Secondary | ICD-10-CM | POA: Diagnosis not present

## 2020-01-27 DIAGNOSIS — Z8673 Personal history of transient ischemic attack (TIA), and cerebral infarction without residual deficits: Secondary | ICD-10-CM | POA: Diagnosis not present

## 2020-01-27 DIAGNOSIS — E785 Hyperlipidemia, unspecified: Secondary | ICD-10-CM | POA: Diagnosis not present

## 2020-01-27 DIAGNOSIS — I10 Essential (primary) hypertension: Secondary | ICD-10-CM | POA: Diagnosis not present

## 2020-01-27 DIAGNOSIS — Z8619 Personal history of other infectious and parasitic diseases: Secondary | ICD-10-CM | POA: Diagnosis not present

## 2020-01-27 DIAGNOSIS — N400 Enlarged prostate without lower urinary tract symptoms: Secondary | ICD-10-CM | POA: Diagnosis not present

## 2020-01-27 DIAGNOSIS — M545 Low back pain: Secondary | ICD-10-CM | POA: Diagnosis not present

## 2020-01-27 NOTE — Progress Notes (Signed)
Per Ned Card NP called pathology [phone]) to request [facility] 1 Testing on 1/7 or 1/13 material. Also  [email or web address] of request as well.
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
## 2020-02-09 ENCOUNTER — Telehealth: Payer: Self-pay | Admitting: *Deleted

## 2020-02-10 ENCOUNTER — Inpatient Hospital Stay: Payer: Medicare Other

## 2020-02-10 ENCOUNTER — Encounter (HOSPITAL_COMMUNITY): Payer: Self-pay | Admitting: Oncology

## 2020-02-12 ENCOUNTER — Other Ambulatory Visit: Payer: Self-pay

## 2020-02-12 ENCOUNTER — Encounter: Payer: Self-pay | Admitting: Nurse Practitioner

## 2020-02-12 ENCOUNTER — Inpatient Hospital Stay: Payer: Medicare Other | Attending: Oncology | Admitting: Nurse Practitioner

## 2020-02-12 ENCOUNTER — Ambulatory Visit (HOSPITAL_COMMUNITY)
Admission: RE | Admit: 2020-02-12 | Discharge: 2020-02-12 | Disposition: A | Discharge status: Home / Self Care | Payer: Medicare Other | Source: Ambulatory Visit | Attending: Nurse Practitioner | Admitting: Nurse Practitioner

## 2020-02-12 ENCOUNTER — Inpatient Hospital Stay: Payer: Medicare Other

## 2020-02-12 VITALS — BP 104/50 | HR 91 | Temp 98.7°F | Resp 16 | Ht 67.0 in | Wt 149.2 lb

## 2020-02-12 DIAGNOSIS — Z8619 Personal history of other infectious and parasitic diseases: Secondary | ICD-10-CM | POA: Insufficient documentation

## 2020-02-12 DIAGNOSIS — Z933 Colostomy status: Secondary | ICD-10-CM | POA: Insufficient documentation

## 2020-02-12 DIAGNOSIS — R262 Difficulty in walking, not elsewhere classified: Secondary | ICD-10-CM | POA: Insufficient documentation

## 2020-02-12 DIAGNOSIS — C19 Malignant neoplasm of rectosigmoid junction: Secondary | ICD-10-CM | POA: Diagnosis present

## 2020-02-12 DIAGNOSIS — N19 Unspecified kidney failure: Secondary | ICD-10-CM | POA: Insufficient documentation

## 2020-02-12 DIAGNOSIS — M549 Dorsalgia, unspecified: Secondary | ICD-10-CM | POA: Insufficient documentation

## 2020-02-12 DIAGNOSIS — Z7189 Other specified counseling: Secondary | ICD-10-CM | POA: Insufficient documentation

## 2020-02-12 DIAGNOSIS — D649 Anemia, unspecified: Secondary | ICD-10-CM | POA: Insufficient documentation

## 2020-02-12 DIAGNOSIS — C20 Malignant neoplasm of rectum: Secondary | ICD-10-CM | POA: Insufficient documentation

## 2020-02-12 DIAGNOSIS — R062 Wheezing: Secondary | ICD-10-CM | POA: Insufficient documentation

## 2020-02-12 DIAGNOSIS — K5900 Constipation, unspecified: Secondary | ICD-10-CM | POA: Diagnosis not present

## 2020-02-12 DIAGNOSIS — N400 Enlarged prostate without lower urinary tract symptoms: Secondary | ICD-10-CM | POA: Diagnosis not present

## 2020-02-12 DIAGNOSIS — Z87891 Personal history of nicotine dependence: Secondary | ICD-10-CM | POA: Diagnosis not present

## 2020-02-12 DIAGNOSIS — Z905 Acquired absence of kidney: Secondary | ICD-10-CM | POA: Insufficient documentation

## 2020-02-12 MED ORDER — ALBUTEROL SULFATE HFA 108 (90 BASE) MCG/ACT IN AERS
2.0000 | INHALATION_SPRAY | Freq: Once | RESPIRATORY_TRACT | Status: AC
  Administered 2020-02-12: 2 via RESPIRATORY_TRACT

## 2020-02-12 MED ORDER — ALBUTEROL SULFATE HFA 108 (90 BASE) MCG/ACT IN AERS
INHALATION_SPRAY | RESPIRATORY_TRACT | Status: AC
  Filled 2020-02-12: qty 6.7

## 2020-02-12 MED ORDER — ALBUTEROL SULFATE (2.5 MG/3ML) 0.083% IN NEBU
2.5000 mg | INHALATION_SOLUTION | Freq: Once | RESPIRATORY_TRACT | Status: DC
  Filled 2020-02-12: qty 3

## 2020-02-12 NOTE — Progress Notes (Signed)
After Albuterol inhaler 2 puff patients vital signs went up to BP: 104/50 P: 91 O2: 94%

## 2020-02-12 NOTE — Progress Notes (Signed)
START ON PATHWAY REGIMEN - Colorectal     A cycle is every 14 days:     Oxaliplatin      Leucovorin      Fluorouracil      Fluorouracil   **Always confirm dose/schedule in your pharmacy ordering system**  Patient Characteristics: Distant Metastases, Nonsurgical Candidate, KRAS/NRAS Mutation Positive/Unknown (BRAF V600 Wild-Type/Unknown), Standard Cytotoxic Therapy, First Line Standard Cytotoxic Therapy, Bevacizumab Ineligible, PS = 0,1 Tumor Location: Colon Therapeutic Status: Distant Metastases Microsatellite/Mismatch Repair Status: MSS/pMMR BRAF Mutation Status: Wild-Type (no mutation) KRAS/NRAS Mutation Status: Mutation Positive Standard Cytotoxic Line of Therapy: First Line Standard Cytotoxic Therapy ECOG Performance Status: 1 Bevacizumab Eligibility: Ineligible Intent of Therapy: Non-Curative / Palliative Intent, Discussed with Patient
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
## 2020-02-17 ENCOUNTER — Telehealth: Payer: Self-pay | Admitting: Oncology

## 2020-02-17 NOTE — Telephone Encounter (Signed)
Scheduled per los. Called, not able to leave msg. Mailed printout  

## 2020-02-18 ENCOUNTER — Inpatient Hospital Stay: Payer: Medicare Other

## 2020-02-21 ENCOUNTER — Other Ambulatory Visit: Payer: Self-pay | Admitting: Oncology

## 2020-02-21 NOTE — Progress Notes (Deleted)
[facility]   Telephone:([phone] [phone]   Clinic Follow up Note   Patient Care Team: Megan Mans, NP as PCP - General (Nurse Practitioner) 02/21/2020  CHIEF COMPLAINT: F/u colon cancer   INTERVAL HISTORY: Luke Wiggins returns for f/u and to begin FOLFOX as scheduled. He was last seen on 02/12/20.    REVIEW OF SYSTEMS:   Constitutional: Denies fevers, chills or abnormal weight loss Eyes: Denies blurriness of vision Ears, nose, mouth, throat, and face: Denies mucositis or sore throat Respiratory: Denies cough, dyspnea or wheezes Cardiovascular: Denies palpitation, chest discomfort or lower extremity swelling Gastrointestinal:  Denies nausea, heartburn or change in bowel habits Skin: Denies abnormal skin rashes Lymphatics: Denies new lymphadenopathy or easy bruising Neurological:Denies numbness, tingling or new weaknesses Behavioral/Psych: Mood is stable, no new changes  All other systems were reviewed with the patient and are negative.  MEDICAL HISTORY:  Past Medical History:  Diagnosis Date  . BPH (benign prostatic hypertrophy)   . Cancer ([place])   . Chronic constipation   . Chronic pain   . COPD (chronic obstructive pulmonary disease) ([place])   . Depression   . Hepatitis C   . Hyperlipidemia   . Hypertension   . Hyponatremia   . Hypothyroidism   . Insomnia     SURGICAL HISTORY: Past Surgical History:  Procedure Laterality Date  . ADRENALECTOMY Left   . IR IMAGING GUIDED PORT INSERTION  01/13/2020  . LAPAROTOMY N/A 01/07/2020   Procedure: EXPLORATORY LAPAROTOMY, PARTIAL COLECTOMY,COLOSTOMY,;  Surgeon: [name], MD;  Location: [facility];  Service: General;  Laterality: N/A;  . NEPHRECTOMY Left     I have reviewed the social history and family history with the patient and they are unchanged from previous note.  ALLERGIES:  has No Known Allergies.  MEDICATIONS:  Current Outpatient Medications  Medication Sig Dispense Refill  .  acetaminophen (TYLENOL) 500 MG tablet 1000 mg every 6 hours as needed for pain control.  As pain improves this can be changed to every 6 HOURS as needed. Do not exceed 4000 mg of Tylenol/acetaminophen per day it can harm your liver. 30 tablet 0  . albuterol (PROAIR HFA) 108 (90 Base) MCG/ACT inhaler Inhale 2 puffs into the lungs every 6 (six) hours as needed for wheezing or shortness of breath.    Marland Kitchen amLODipine (NORVASC) 5 MG tablet Take 5 mg by mouth daily.    Marland Kitchen aspirin 81 MG chewable tablet Chew 81 mg by mouth daily.    Marland Kitchen atorvastatin (LIPITOR) 10 MG tablet Take 10 mg by mouth daily.    . calcium carbonate (TUMS EX) 750 MG chewable tablet Chew 1 tablet by mouth 2 (two) times daily as needed for heartburn.    . cetirizine (ZYRTEC) 10 MG tablet Take 10 mg by mouth daily.    Marland Kitchen dextromethorphan-guaiFENesin (MUCINEX DM) 30-600 MG 12hr tablet Take 2 tablets by mouth every 12 (twelve) hours as needed for cough.    . docusate sodium (COLACE) 100 MG capsule Take 100 mg by mouth daily.     . eszopiclone (LUNESTA) 1 MG TABS tablet Take 1 mg by mouth at bedtime. Take immediately before bedtime     . feeding supplement (BOOST / RESOURCE BREEZE) LIQD Take 60 mLs by mouth 3 (three) times daily between meals.    Marland Kitchen FERRETTS 325 (106 Fe) MG TABS tablet Take 1 tablet by mouth daily.    . finasteride (PROSCAR) 5 MG tablet Take 5 mg by mouth daily.    Marland Kitchen  levothyroxine (SYNTHROID) 50 MCG tablet Take 50 mcg by mouth daily.    . Melatonin 3 MG TABS Take 6 mg by mouth at bedtime.    . Menthol-Methyl Salicylate (MUSCLE RUB EX) Apply 1 application topically 3 (three) times daily as needed (sore muscles).     . mupirocin ointment (BACTROBAN) 2 % Place into the nose 2 (two) times daily. 22 g 0  . nitroGLYCERIN (NITROSTAT) 0.3 MG SL tablet Place 0.3 mg under the tongue every 5 (five) minutes as needed for chest pain.    Marland Kitchen oxyCODONE (OXY IR/ROXICODONE) 5 MG immediate release tablet Take 1 tablet (5 mg total) by mouth every 4  (four) hours as needed for severe pain or breakthrough pain. 40 tablet 0  . pantoprazole (PROTONIX) 40 MG tablet Take 40 mg by mouth daily.    . traZODone (DESYREL) 50 MG tablet Take 125 mg by mouth at bedtime.      No current facility-administered medications for this visit.    PHYSICAL EXAMINATION: ECOG PERFORMANCE STATUS: {CHL ONC ECOG PS:[phone]}  There were no vitals filed for this visit. There were no vitals filed for this visit.  GENERAL:alert, no distress and comfortable SKIN: skin color, texture, turgor are normal, no rashes or significant lesions EYES: normal, Conjunctiva are pink and non-injected, sclera clear OROPHARYNX:no exudate, no erythema and lips, buccal mucosa, and tongue normal  NECK: supple, thyroid normal size, non-tender, without nodularity LYMPH:  no palpable lymphadenopathy in the cervical, axillary or inguinal LUNGS: clear to auscultation and percussion with normal breathing effort HEART: regular rate & rhythm and no murmurs and no lower extremity edema ABDOMEN:abdomen soft, non-tender and normal bowel sounds Musculoskeletal:no cyanosis of digits and no clubbing  NEURO: alert & oriented x 3 with fluent speech, no focal motor/sensory deficits  LABORATORY DATA:  I have reviewed the data as listed CBC Latest Ref Rng & Units 01/14/2020 01/13/2020 01/12/2020  WBC 4.0 - 10.5 K/uL 9.8 10.2 9.5  Hemoglobin 13.0 - 17.0 g/dL 10.0(L) 9.3(L) 9.4(L)  Hematocrit 39.0 - 52.0 % 32.8(L) 30.5(L) 31.7(L)  Platelets 150 - 400 K/uL 318 286 261     CMP Latest Ref Rng & Units 01/14/2020 01/11/2020 01/10/2020  Glucose 70 - 99 mg/dL 107(H) 138(H) 127(H)  BUN 8 - 23 mg/dL 37(H) 17 19  Creatinine 0.61 - 1.24 mg/dL 2.05(H) 1.78(H) 1.99(H)  Sodium 135 - 145 mmol/L 134(L) 136 130(L)  Potassium 3.5 - 5.1 mmol/L 4.8 4.2 4.8  Chloride 98 - 111 mmol/L 104 103 103  CO2 22 - 32 mmol/L 20(L) 22 20(L)  Calcium 8.9 - 10.3 mg/dL 8.9 8.7(L) 8.4(L)  Total Protein 6.5 - 8.1 g/dL - 7.2 -    Total Bilirubin 0.3 - 1.2 mg/dL - 0.5 -  Alkaline Phos 38 - 126 U/L - 86 -  AST 15 - 41 U/L - 12(L) -  ALT 0 - 44 U/L - 14 -      RADIOGRAPHIC STUDIES: I have personally reviewed the radiological images as listed and agreed with the findings in the report. No results found.   ASSESSMENT & PLAN:  No problem-specific Assessment & Plan notes found for this encounter.   No orders of the defined types were placed in this encounter.  All questions were answered. The patient knows to call the clinic with any problems, questions or concerns. No barriers to learning was detected. I spent {CHL ONC TIME VISIT - [id number] counseling the patient face to face. The total time spent in the appointment was {CHL  ONC TIME VISIT - [id number] and more than 50% was on counseling and review of test results     Alla Feeling, NP 02/21/20

## 2020-02-22 ENCOUNTER — Telehealth: Payer: Self-pay

## 2020-02-22 ENCOUNTER — Other Ambulatory Visit: Payer: Medicare Other

## 2020-02-22 ENCOUNTER — Inpatient Hospital Stay: Payer: Medicare Other

## 2020-02-22 ENCOUNTER — Ambulatory Visit: Payer: Medicare Other

## 2020-02-22 ENCOUNTER — Ambulatory Visit: Payer: Medicare Other | Admitting: Nurse Practitioner

## 2020-02-22 NOTE — Telephone Encounter (Signed)
Spoke with Lauralyn Primes at [facility] regarding pt's appointment on 02/22/20

## 2020-02-23 ENCOUNTER — Telehealth: Payer: Self-pay | Admitting: Nurse Practitioner

## 2020-02-23 ENCOUNTER — Telehealth: Payer: Self-pay | Admitting: *Deleted

## 2020-02-23 NOTE — Telephone Encounter (Signed)
Spoke to [place] at [facility] gave appts for tomorrow & Friday.

## 2020-02-23 NOTE — Telephone Encounter (Signed)
Scheduled appt per 2/22 sch message- unable to reach facility . Let Otho Ket know and she will try to reach them

## 2020-02-24 ENCOUNTER — Inpatient Hospital Stay: Payer: Medicare Other

## 2020-02-24 ENCOUNTER — Telehealth: Payer: Self-pay | Admitting: *Deleted

## 2020-02-24 ENCOUNTER — Other Ambulatory Visit: Payer: Self-pay

## 2020-02-24 ENCOUNTER — Inpatient Hospital Stay (HOSPITAL_BASED_OUTPATIENT_CLINIC_OR_DEPARTMENT_OTHER): Payer: Medicare Other | Admitting: Nurse Practitioner

## 2020-02-24 ENCOUNTER — Encounter: Payer: Self-pay | Admitting: Nurse Practitioner

## 2020-02-24 VITALS — BP 91/63 | HR 104 | Temp 98.0°F | Resp 20 | Ht 67.0 in | Wt 145.9 lb

## 2020-02-24 DIAGNOSIS — Z95828 Presence of other vascular implants and grafts: Secondary | ICD-10-CM

## 2020-02-24 DIAGNOSIS — C19 Malignant neoplasm of rectosigmoid junction: Secondary | ICD-10-CM | POA: Diagnosis not present

## 2020-02-24 DIAGNOSIS — C187 Malignant neoplasm of sigmoid colon: Secondary | ICD-10-CM

## 2020-02-24 DIAGNOSIS — C20 Malignant neoplasm of rectum: Secondary | ICD-10-CM

## 2020-02-24 LAB — CMP (CANCER CENTER ONLY)
ALT: 17 U/L (ref 0–44)
AST: 23 U/L (ref 15–41)
Albumin: 2.5 g/dL — ABNORMAL LOW (ref 3.5–5.0)
Alkaline Phosphatase: 325 U/L — ABNORMAL HIGH (ref 38–126)
Anion gap: 12 (ref 5–15)
BUN: 39 mg/dL — ABNORMAL HIGH (ref 8–23)
CO2: 19 mmol/L — ABNORMAL LOW (ref 22–32)
Calcium: 9.4 mg/dL (ref 8.9–10.3)
Chloride: 101 mmol/L (ref 98–111)
Creatinine: 2.24 mg/dL — ABNORMAL HIGH (ref 0.61–1.24)
GFR, Est AFR Am: 33 mL/min — ABNORMAL LOW (ref >60)
GFR, Estimated: 29 mL/min — ABNORMAL LOW (ref >60)
Glucose, Bld: 118 mg/dL — ABNORMAL HIGH (ref 70–99)
Potassium: 4 mmol/L (ref 3.5–5.1)
Sodium: 132 mmol/L — ABNORMAL LOW (ref 135–145)
Total Bilirubin: 0.4 mg/dL (ref 0.3–1.2)
Total Protein: 8.2 g/dL — ABNORMAL HIGH (ref 6.5–8.1)

## 2020-02-24 LAB — CBC WITH DIFFERENTIAL (CANCER CENTER ONLY)
Abs Immature Granulocytes: 0.13 10*3/uL — ABNORMAL HIGH (ref 0.00–0.07)
Basophils Absolute: 0.1 10*3/uL (ref 0.0–0.1)
Basophils Relative: 0 %
Eosinophils Absolute: 0.2 10*3/uL (ref 0.0–0.5)
Eosinophils Relative: 2 %
HCT: 33.1 % — ABNORMAL LOW (ref 39.0–52.0)
Hemoglobin: 10.1 g/dL — ABNORMAL LOW (ref 13.0–17.0)
Immature Granulocytes: 1 %
Lymphocytes Relative: 23 %
Lymphs Abs: 3.8 10*3/uL (ref 0.7–4.0)
MCH: 23.1 pg — ABNORMAL LOW (ref 26.0–34.0)
MCHC: 30.5 g/dL (ref 30.0–36.0)
MCV: 75.7 fL — ABNORMAL LOW (ref 80.0–100.0)
Monocytes Absolute: 1.3 10*3/uL — ABNORMAL HIGH (ref 0.1–1.0)
Monocytes Relative: 8 %
Neutro Abs: 11 10*3/uL — ABNORMAL HIGH (ref 1.7–7.7)
Neutrophils Relative %: 66 %
Platelet Count: 415 10*3/uL — ABNORMAL HIGH (ref 150–400)
RBC: 4.37 MIL/uL (ref 4.22–5.81)
RDW: 15.4 % (ref 11.5–15.5)
WBC Count: 16.5 10*3/uL — ABNORMAL HIGH (ref 4.0–10.5)
nRBC: 0 % (ref 0.0–0.2)

## 2020-02-24 LAB — CEA (IN HOUSE-CHCC): CEA (CHCC-In House): 165.8 ng/mL — ABNORMAL HIGH (ref 0.00–5.00)

## 2020-02-24 MED ORDER — SODIUM CHLORIDE 0.9% FLUSH
10.0000 mL | INTRAVENOUS | Status: DC | PRN
  Administered 2020-02-24: 10 mL via INTRAVENOUS
  Filled 2020-02-24: qty 10
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
## 2020-02-24 NOTE — Telephone Encounter (Signed)
Hospice referral put in place for

## 2020-02-24 NOTE — Telephone Encounter (Signed)
Hospice referral put in place at [facility].

## 2020-02-25 ENCOUNTER — Telehealth: Payer: Self-pay | Admitting: Oncology

## 2020-02-25 NOTE — Telephone Encounter (Signed)
Scheduled per los. Called, no answer. Mailed printout  

## 2020-02-26 ENCOUNTER — Inpatient Hospital Stay: Payer: Medicare Other

## 2020-03-09 ENCOUNTER — Telehealth: Payer: Self-pay | Admitting: *Deleted

## 2020-03-09 ENCOUNTER — Inpatient Hospital Stay: Payer: Medicare Other | Admitting: Oncology

## 2020-03-09 NOTE — Telephone Encounter (Signed)
Called SNF to f/u on "no show" today. Was informed that he died on April 01, 2020.

## 2020-03-31 DEATH — deceased

## 2021-12-24 IMAGING — DX DG ABDOMEN 1V
2 series · 2 of 2 positions shown · non-contrast
Comparison: 01/05/2020

CLINICAL DATA: : Obstruction.

EXAM:
ABDOMEN - 1 VIEW

[abdomen kub (1 of 2)]
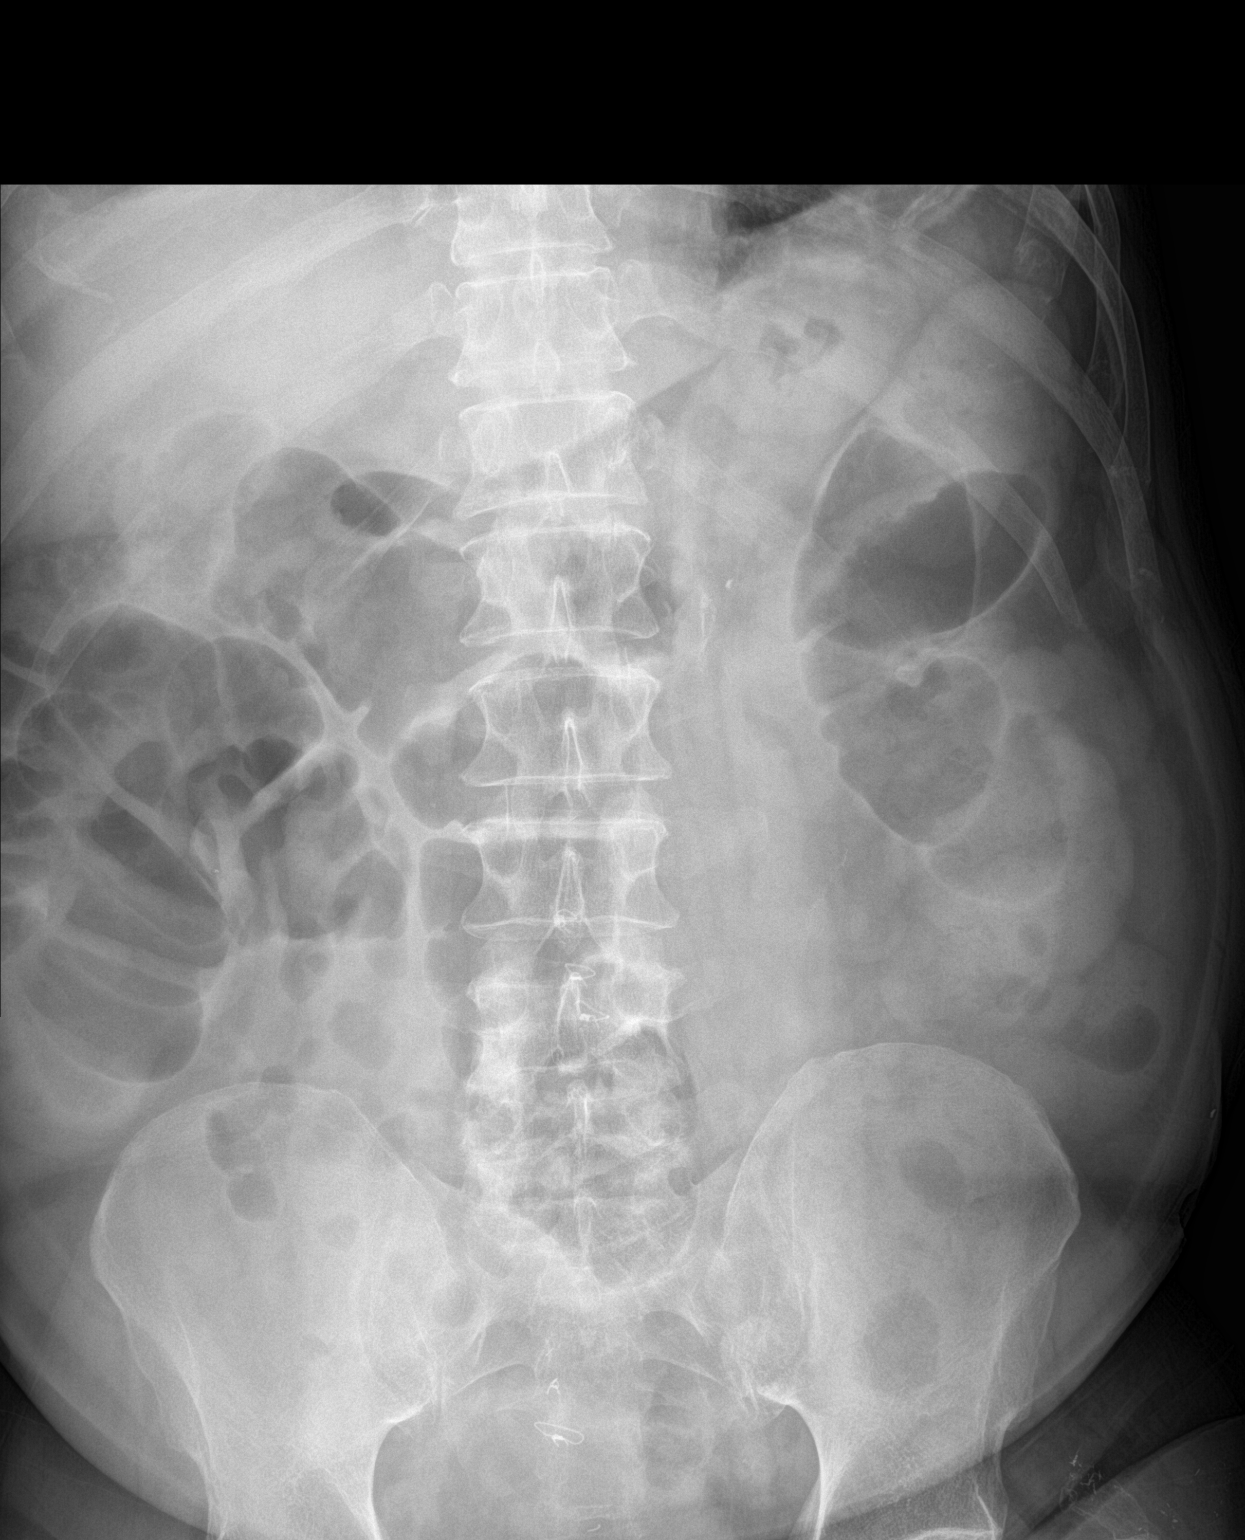

[abdomen kub (2 of 2)]
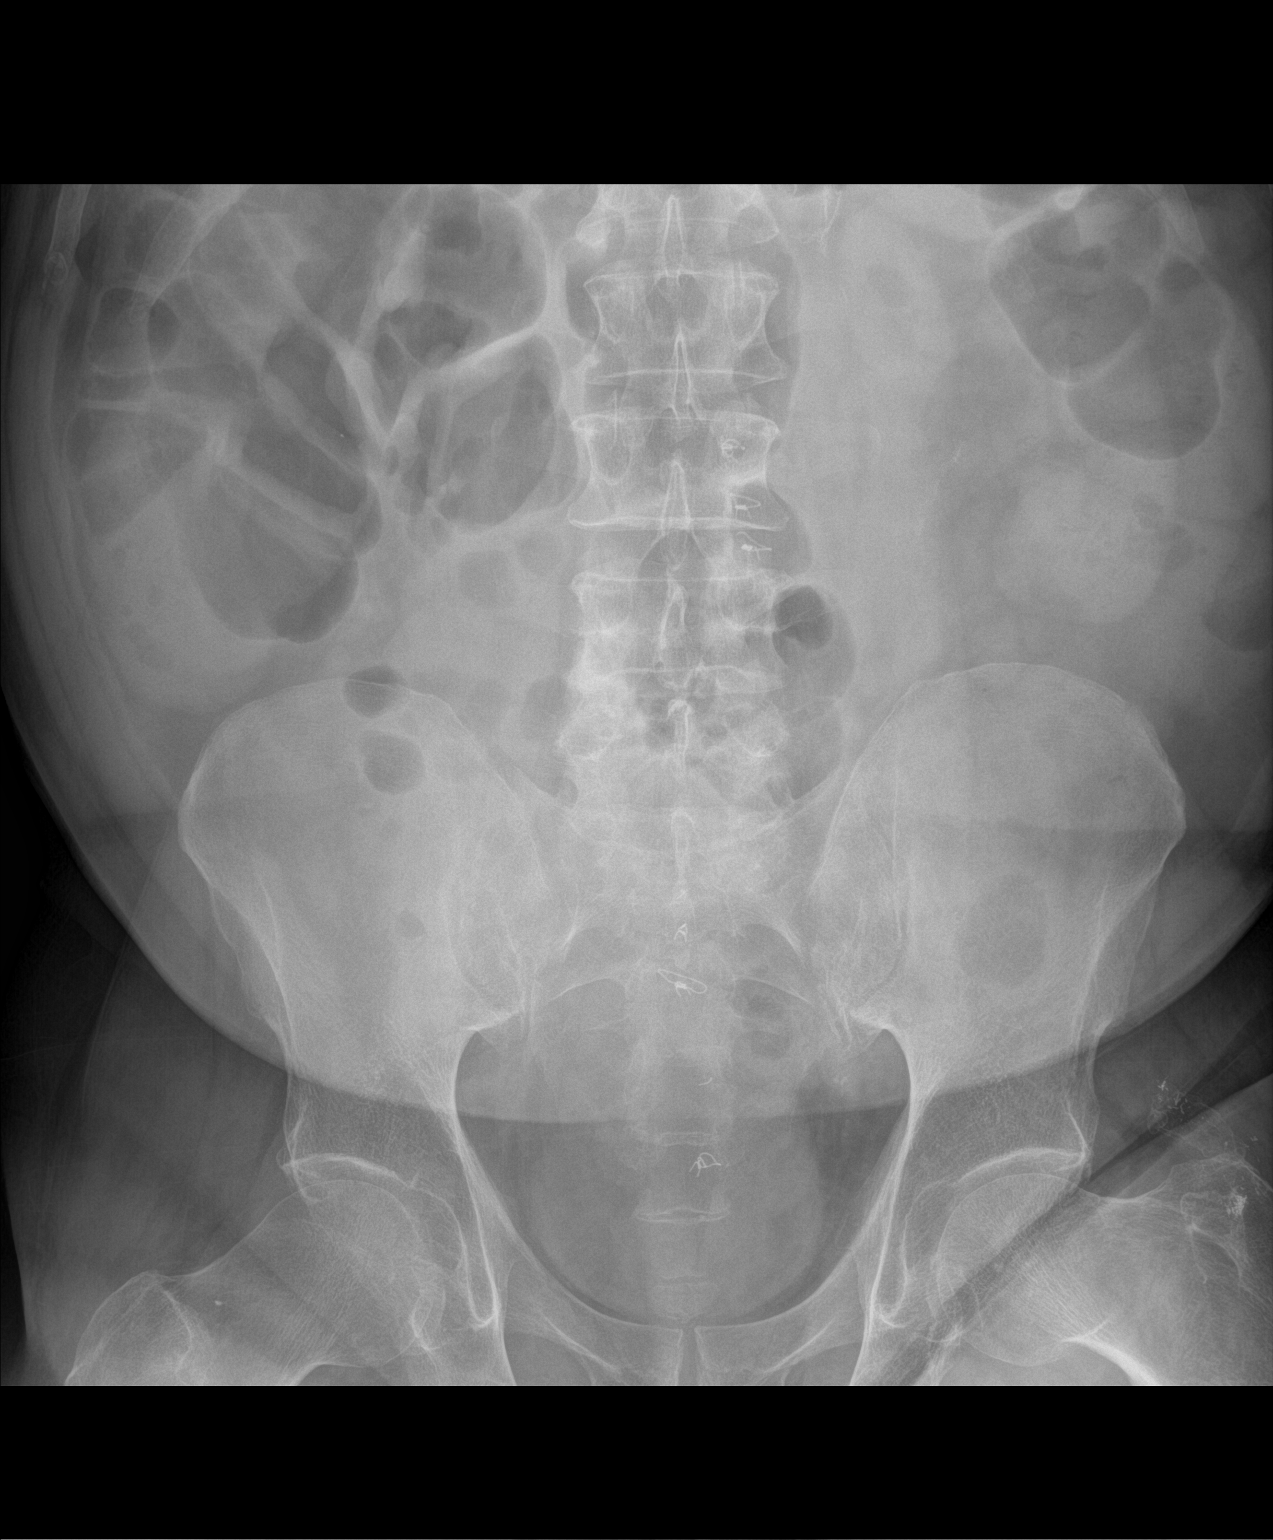

[2 of 2 positions shown; findings below may reference images not displayed]

FINDINGS: There is a small amount of stool in the colon, greatly decreased
from prior. There is mildly increased gas in the colon without
dilated loops of bowel seen to suggest obstruction. No acute osseous
abnormality is identified.
IMPRESSION: Decreased colonic stool. No evidence of bowel obstruction.

## 2021-12-31 IMAGING — US US BIOPSY CORE LIVER
1 series · 13 of 25 positions shown · non-contrast
Comparison: none

INDICATION: 68-year-old male with colon cancer and liver lesions concerning for
hepatic metastatic disease. He presents for ultrasound-guided core
biopsy to confirm tissue diagnosis.

[Series 1: us biopsy core liver · 13 of 31 slices shown]
[im 1/31]
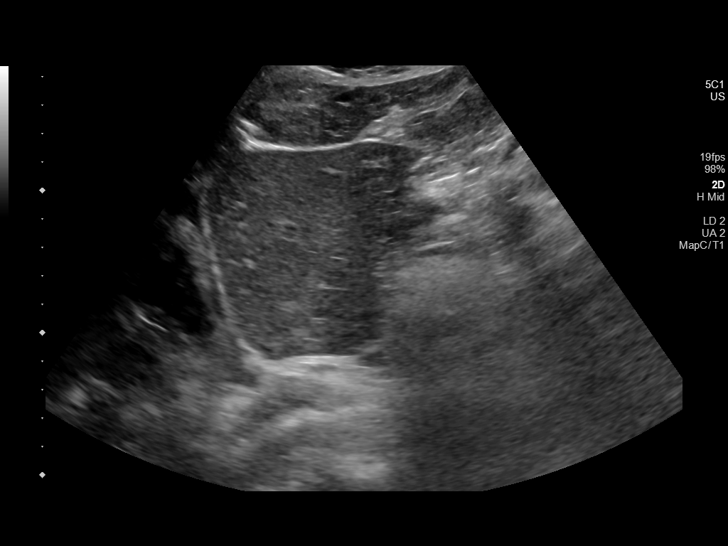
[im 3/31]
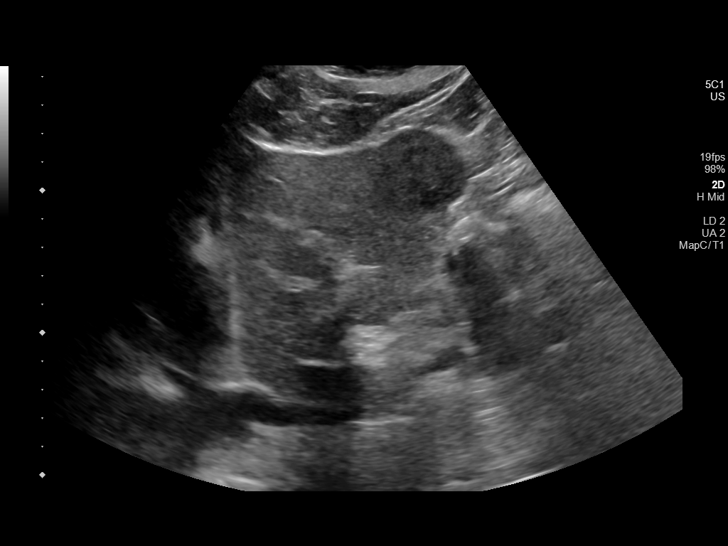
[im 6/31]
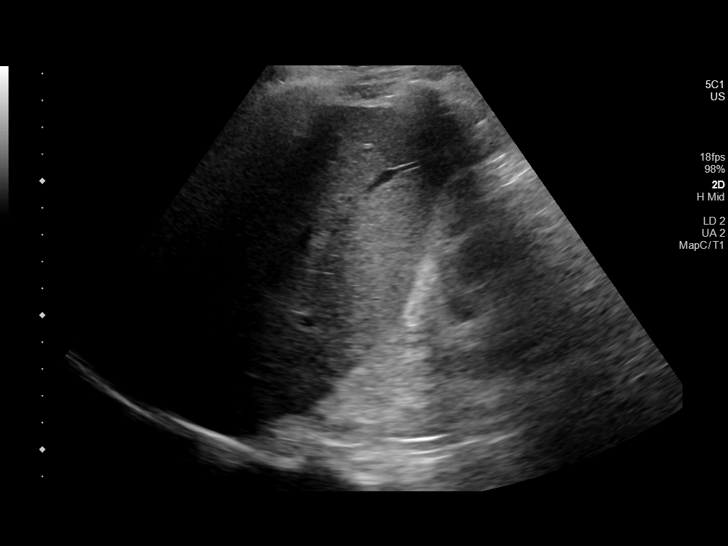
[im 8/31]
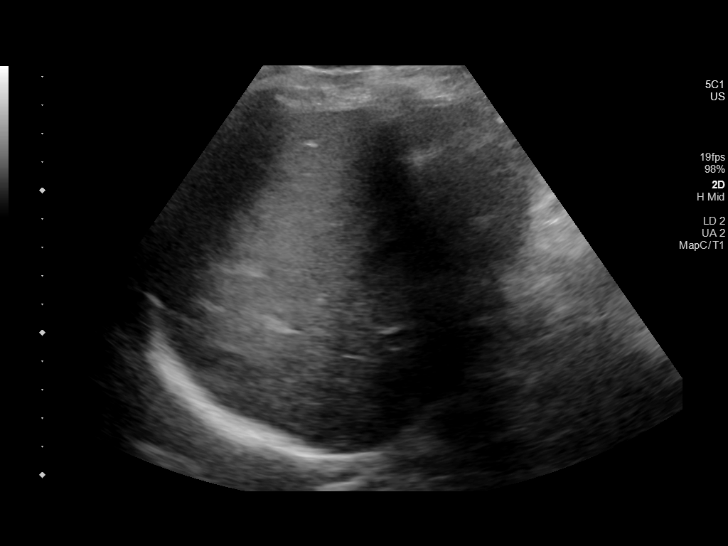
[im 11/31]
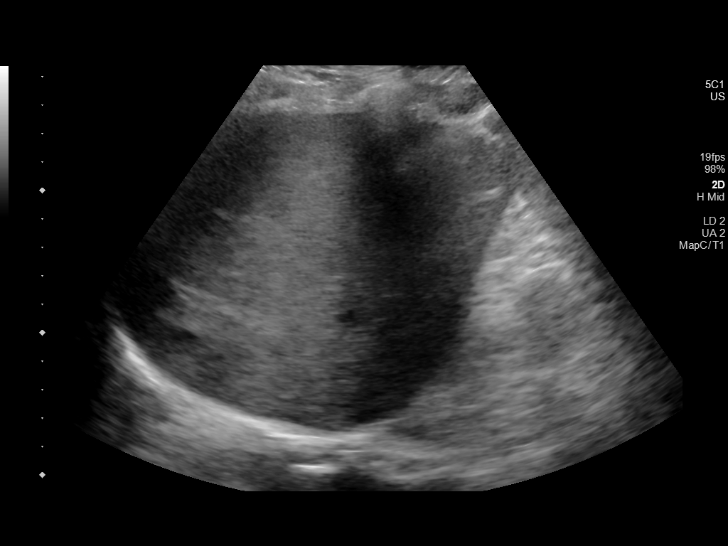
[im 13/31]
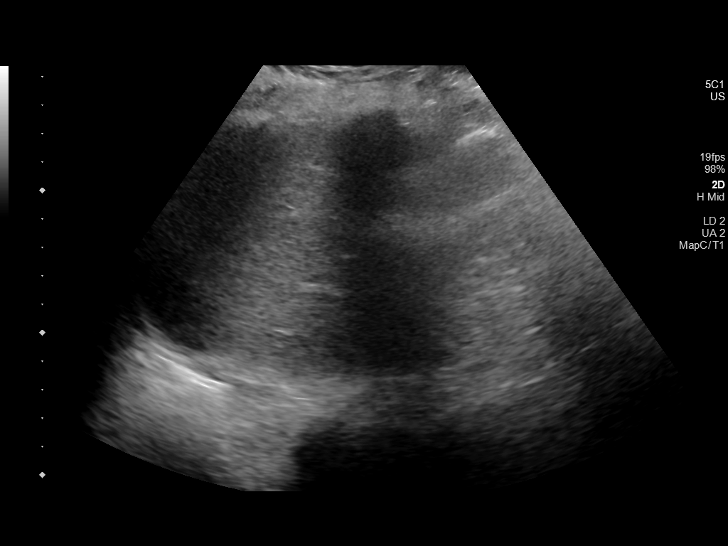
[im 16/31]
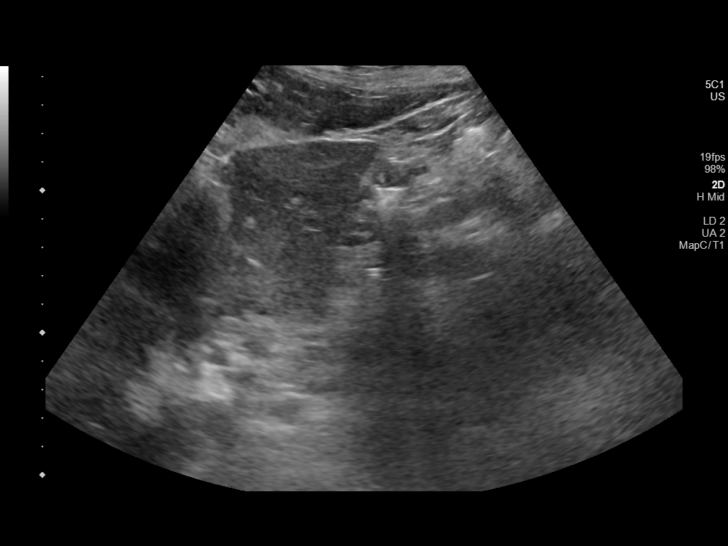
[im 18/31]
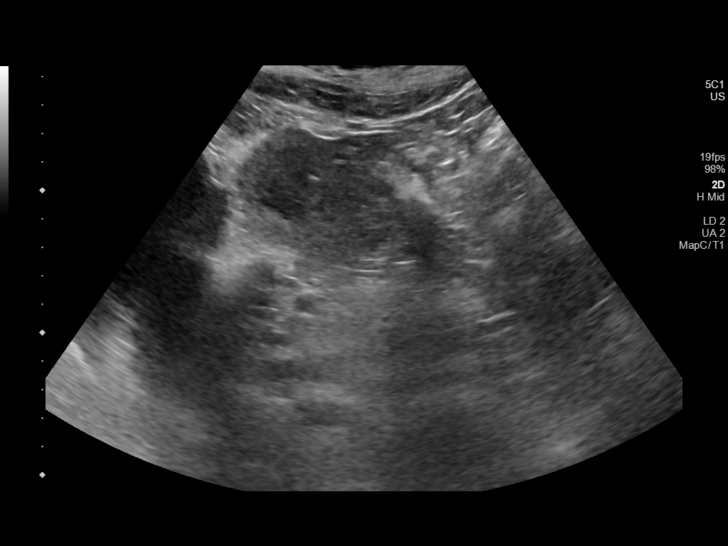
[im 21/31]
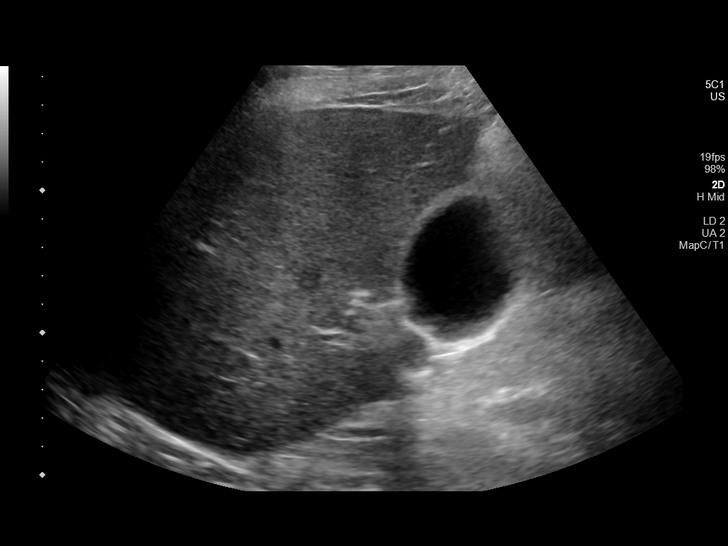
[im 23/31]
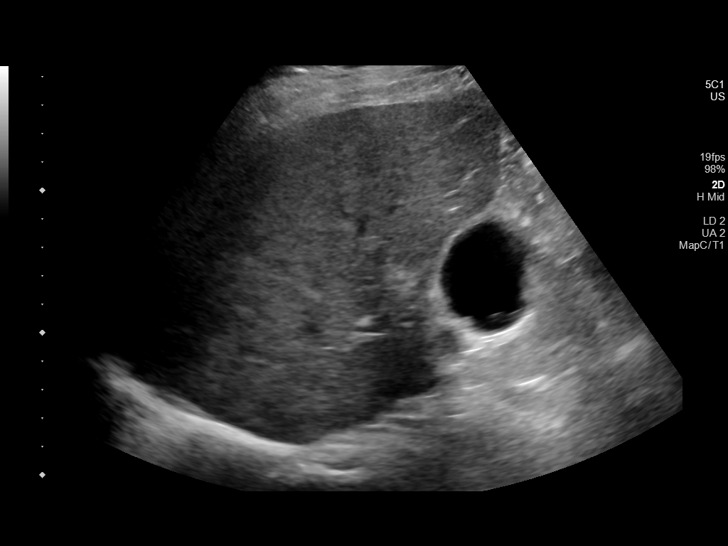
[im 26/31]
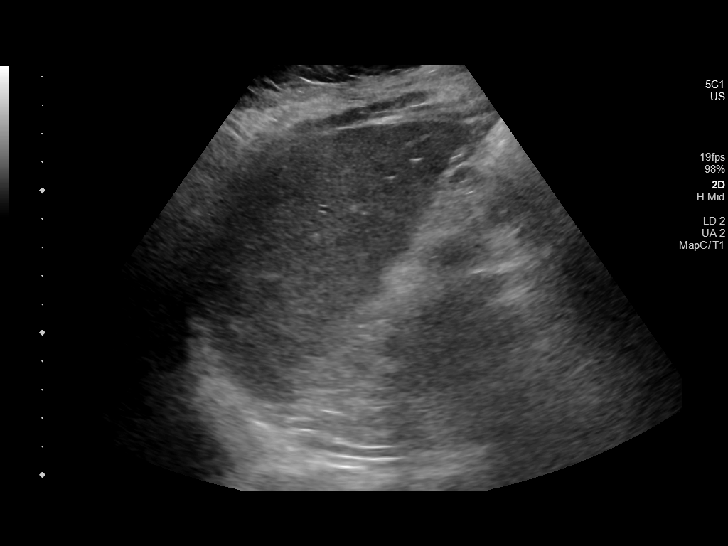
[im 28/31]
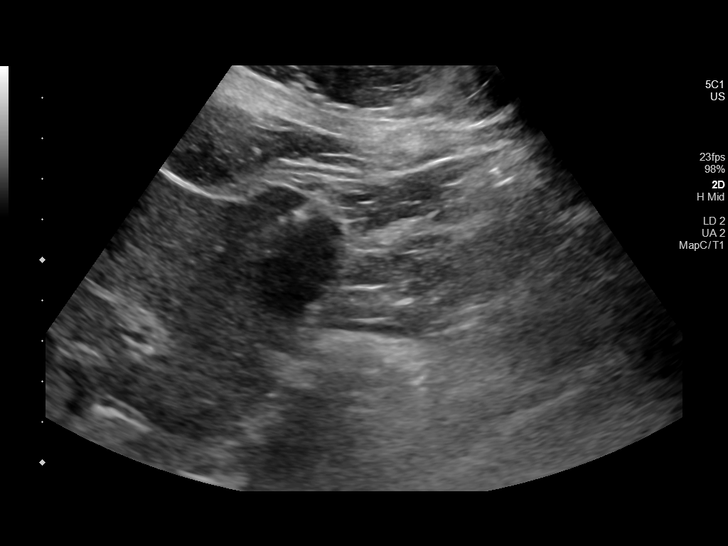
[im 31/31]
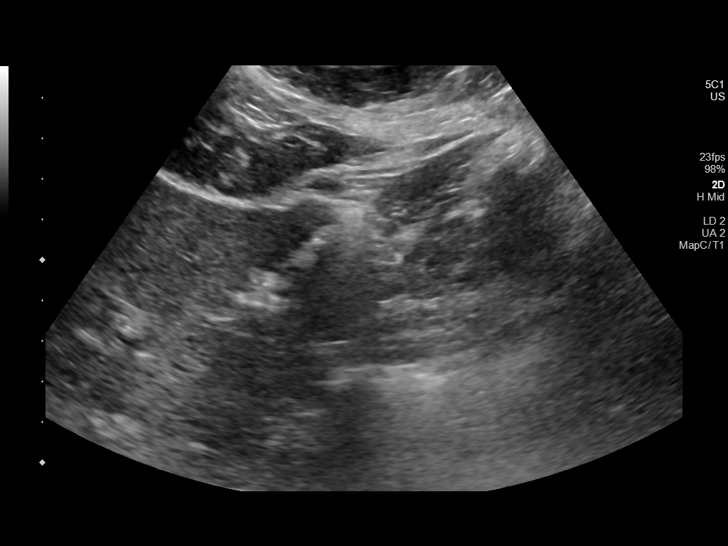

[13 of 25 positions shown; findings below may reference images not displayed]

EXAM:
ULTRASOUND BIOPSY CORE LIVER

MEDICATIONS:
None.

ANESTHESIA/SEDATION:
Moderate (conscious) sedation was employed during this procedure. A
total of Versed 1 mg and Fentanyl 50 mcg was administered
intravenously.

Moderate Sedation Time: 13 minutes. The patient's level of
consciousness and vital signs were monitored continuously by
radiology nursing throughout the procedure under my direct
supervision.

FLUOROSCOPY TIME:  Fluoroscopy Time: 0 minutes 0 seconds (0 mGy).

COMPLICATIONS:
None immediate.

PROCEDURE:
Informed written consent was obtained from the patient after a
thorough discussion of the procedural risks, benefits and
alternatives. All questions were addressed. Maximal Sterile Barrier
Technique was utilized including caps, mask, sterile gowns, sterile
gloves, sterile drape, hand hygiene and skin antiseptic. A timeout
was performed prior to the initiation of the procedure.

The liver was interrogated with ultrasound. A lesion in the left
hemi liver was identified. A suitable skin entry site was selected
and marked. Local anesthesia was attained by infiltration with 1%
lidocaine. A small dermatotomy was made. Under real-time ultrasound
guidance, a 17 gauge introducer needle was advanced into the margin
of the lesion. Multiple 18 gauge core biopsies were then coaxially
obtained using the [employer] automated biopsy device. Biopsy
specimens were placed in formalin and delivered to pathology for
further analysis.

As the introducer needle was removed, the biopsy tract was embolized
with a Gel-Foam slurry. Post biopsy ultrasound imaging demonstrates
no evidence of immediate complication. The patient tolerated the
procedure well.
IMPRESSION: Technically successful ultrasound-guided core biopsy of liver
lesion.

## 2023-11-20 NOTE — Telephone Encounter (Signed)
Telephone call
